# Patient Record
Sex: Female | Born: 1995
Health system: Western US, Academic
[De-identification: ages and names within clinical notes are randomized; demographics above are authoritative.]

## PROBLEM LIST (undated history)

## (undated) DIAGNOSIS — J45909 Unspecified asthma, uncomplicated: Secondary | ICD-10-CM

## (undated) SURGERY — Surgical Case
Anesthesia: *Unknown

---

## 2015-05-03 DIAGNOSIS — Q913 Trisomy 18, unspecified: Secondary | ICD-10-CM

## 2019-12-17 ENCOUNTER — Inpatient Hospital Stay
Admit: 2019-12-17 | Discharge: 2019-12-18 | Disposition: A | Discharge status: Assisted Living Facility | Payer: Commercial Managed Care - Pharmacy Benefit Manager | Source: Home / Self Care

## 2019-12-17 ENCOUNTER — Inpatient Hospital Stay

## 2019-12-17 DIAGNOSIS — F23 Brief psychotic disorder: Secondary | ICD-10-CM

## 2019-12-17 LAB — UA,Microscopic

## 2019-12-17 LAB — UA,Dipstick

## 2019-12-17 LAB — CBC: RED BLOOD CELL COUNT: 4.92 x10E6/uL (ref 3.96–5.09)

## 2019-12-17 LAB — Expedited COVID-19 and Influenza A B PCR: COVID-19 PCR: NOT DETECTED

## 2019-12-17 LAB — CREATININE: GFR ESTIMATE FOR NON-AFRICAN AMERICAN: >89 mL/min/{1.73_m2} (ref 0.60–1.30)

## 2019-12-17 LAB — Electrolyte Panel: CHLORIDE: 104 mmol/L (ref 96–106)

## 2019-12-17 LAB — Glucose, Whole Blood: GLUCOSE, WHOLE BLOOD: 107 mg/dL — ABNORMAL HIGH (ref 65–99)

## 2019-12-17 LAB — Urea Nitrogen: UREA NITROGEN: 7 mg/dL (ref 7–22)

## 2019-12-17 LAB — Pregnancy Test,Urine: PREGNANCY TEST,URINE: NEGATIVE

## 2019-12-17 LAB — Drugs of Abuse Scn,Ur: METHADONE: NEGATIVE ng/mL

## 2019-12-17 MED ADMIN — NICOTINE POLACRILEX 2 MG MT GUM: 2 mg | ORAL | @ 11:00:00 | Stop: 2019-12-19 | NDC 00536302923

## 2019-12-17 MED ADMIN — OLANZAPINE 5 MG PO TABS: 5 mg | ORAL | @ 13:00:00 | Stop: 2019-12-19

## 2019-12-17 MED ADMIN — NICOTINE 14 MG/24HR TD PT24: 14 mg | TRANSDERMAL | @ 22:00:00 | Stop: 2019-12-18 | NDC 60505706200

## 2019-12-17 NOTE — Progress Notes
CSW met with DCFS Johny Shock ( @ [phone]) in person to discuss pt's case and the safety of the kids. CSW informed Anderson Malta that pt will require psychiatric hospitalization. CSW also shared with Anderson Malta that pt is now requesting for her kids to be placed with grandmother  Carleene Overlie who lives in [place]. Carleene Overlie can be reached at [phone]

## 2019-12-17 NOTE — Progress Notes
Patient referred for psychiatric transfer; patient requires inpatient stabilization but cannot be hospitalized at [facility] at this time. Pt does not have medical insurance at this time. The following outside contracted psychiatric hospitals were contacted:  Sw spoke with Asinto at [facility] at [phone] re  bed availability, faxed pt's packet to [phone];  SW spoke with Diana at [facility] at [phone] re bed availability, faxed pt's packet to [phone]

## 2019-12-17 NOTE — Progress Notes
CSW spoke with Dalia from [facility] who stated they would not be able to accept patient at this time. CSW requested bed at [facility], however, no beds available.

## 2019-12-17 NOTE — ED Notes
Psych doctor at bedside, approached this RN and stated that pt admitted to having a gun on her person.  This RN called security for a search, pt to be put on a 5150 hold.  Security came to bedside and pt was appropriate and gave security gun.  Security explained that [employer] would pick up the gun and she would be able to request the gun from them once discharged.  Two boys at bedside, her sons, unkempt and sleeping at this time, no distress noted.  Sitter at bedside.

## 2019-12-17 NOTE — Consults
SW has been aware of case since patient arrived in ED, asking for psychiatric evaluation. Patient arrived with her two sons (3 and 7); she drove to [place] from [facility] because she is afraid people are trying to kill her. SW notified because, if placed on 5150, children may need DCFS to take custody. ED staff also with concerns re: hygiene, as both the boys and their clothes appear quite dirty.     Discussed with ED attending, resident, and Consulting civil engineer. MDs to see mom first. If mom is placed on detainment, then kids will be checked in for quick assessment. If mom placed on 5150, then Peds Hospitalist will be notified of pending DCFS report (as per protocol) and SW will contact SCAN Coordinator for guidance.     Unfortunately, several critical cases interrupted the flow of this plan. Patient was seen by psych, who determined she does need to be on a hold. Patient had a loaded gun with her; psych was able to get her to relinquish the gun to [employer] prior to her being informed of hold. Resident and SW discussed DCFS reporting; Dr. Janene Harvey will file, with SW available to assist.     Contacted SCAN Consultant Jerrye Bushy as Lorain Childes. Suzette Battiest unsure if case should be filed in [place] or [place]. Given that kids will need urgent assist, this writer believes report should be filed in [place]. Veronica in agreement with plan, asks to be kept informed.     SW called DCFS Hotline to determine where to file. Spoke to Mr. Rennis Harding (SW very familiar with Mr. Rennis Harding from previous DCFS interactions). Mr. Rennis Harding states report should be filed in [place] 1) because patient doesn't have an address anywhere right now and 2) because kids will need urgent intervention. SW appreciative.     Received call from [place], [place] Consultant. Informed her of above. She suggests [facility] be notified to assist with the boys. SW thinks [facility] comes in at 0600, will page [phone] at that time.     Drs. Cohn (psych) and ED Meda Coffee and [place]) aware of above. Charge RN aware as well.     DCFS report filed by Psych resident D. AJanene Harvey. [phone]. Per Dr. Janene Harvey, DCFS to respond immediately, to arrive sometime after 0800. Case will be signed out to both the ED SW and CL SW for follow up.        Financial clearance triggered per protocol.     Patient referred for psychiatric transfer; patient requires inpatient stabilization, but cannot be hospitalized in [facility] at this time.     Referrals (FS, 5150, Advisement, Transfer summary, labs) will be faxed to the following facilities, once labs and documentation are complete:      [facility] [phone]  [facility] [phone]    Psych/ED aware.

## 2019-12-17 NOTE — ED Notes
Patient cooperative with vitals. Pt demanded that she speak with the Doctor. Patient stated, '' I don't know what the hell is going on here! These people are tripping, I never used meth in my life''. Patient appears extremely agitated. Pt is speaks with an aggressive tone with staff. Psych tech is with RN ensuring patient is safe. Pt is also responding to internal stimuli. Staff is at bedside to monitor for safety.

## 2019-12-17 NOTE — ED Notes
Dr. Kyung Rudd with Psych rounding on patient.

## 2019-12-17 NOTE — ED Notes
Psych SW, Attending and Resident at bedside.

## 2019-12-17 NOTE — Progress Notes
CSW spoke with Weston Brass at [facility] able to accept this pt due to the fact that she is out of state and is considered to be high risk for COVID. Need to wait 7 days to repeat the test before they can accept her    CSW spoke with Corrie Dandy at [facility] the packet

## 2019-12-17 NOTE — Other
[place] of [place] - Health and Investment banker, corporate of Health Care Services   INVOLUNTARY PATIENT ADVISEMENT  (TO BE READ AND GIVEN TO THE  PATIENT AT TIME OF ADMISSION) Confidential Patient Information  See W&I Code Section 5328 and   HIPAA Priacy Rule 45 C.F.R. Section 164.508   Name of Facility     [facility]   Patient???s Name     Cynthia Donovan Admission Date     12/17/2019   Section 5150(h) of the Welfare and Institutions Code requires that each person admitted to a facility designated by the county for evaluation and treatment be given specific information orally and in writing, and in a language or modality accessible to the person and a record of the advisement be kept in the person???s medical record.   My name is Lyn Hollingshead B. Evette Diclemente My position here is Resident physician   You are being placed in this psychiatric facility because it is our professional opinion, that as a result of a mental health disorder, you are likely to: (check applicable)   [X]  Harm yourself [X]  Harm someone else [  ] Be unable to be take care of your own food clothing or shelter   (List specific facts upon which the allegation of dangerous or gravely disabled due to mental health disorder is based, including pertinent facts arising from the admission interview):   We believe this is true because  You have been feeling very depressed and like people are following you and are out to get you. These feelings have made it difficult to take care of yourself and your children at this time.     You will be held for a period of up to 72 hours. This ( []  does not ) ( [x]  does) include weekends or holidays.   Your 72-hour period begins: 12/17/19 1610    (Time and Date)   Your 72-hour evaluation and treatment period will end at: 12/20/19 9604    (Time and Date)   You will be held for a period up to 72 hours. During the 72 hours you may also be transferred to another facility. You may request to be evaluated or treated at a facility of your choice. You may request to be evaluated or treated by a mental health professional of your choice. We cannot guarantee the facility or mental health professional you choose will be available, but we will honor your choice if we can.     During these 72 hours you will be evaluated by the facility staff, and you may be given treatment, including medications. It is possible for you to be released before the end of the 72 hours. But if the staff decides that you need continued treatment you can be held for a longer period of time. If you are held longer than 72 hours, you have the right to a lawyer and a qualified interpreter and a hearing before a judge. If you are unable to pay for the lawyer, then one will be provided to you free of charge.     If you have questions about your legal rights, you may contact the county Patients??? Rights Advocate at [phone] or [phone] (phone number of county Patients??? Art therapist).   Good cause for Incomplete Advisement N/A Date N/A   Advisement Completed by  (Electronically signed by)  Cecille Po. Seirra Kos Position  Resident physician Language or Modality Used  Spoken English Date  12/17/2019  CC: Original to the Patient   DHCS 1802 (03/2012) [place] to the Patient???s Record

## 2019-12-17 NOTE — ED Notes
Pt stable, 2 children at bedside, no distress noted.

## 2019-12-17 NOTE — ED Notes
Psych resident and SW at bedside to discuss plan of care.

## 2019-12-17 NOTE — ED Provider Notes
Cynthia Donovan [facility]  Emergency Department Service Report    Triage   Psychiatric Evaluation (asking to be evaluated by psychiatrist ; stated that she is being followed and no one is believing her; denies SI, denies Hi; has 2 sons 61 and 26 years old with her. Arrived here from [facility] Wednesday morning)    Arrived on 12/17/2019 at 12:34 AM  Arrived by Walk-in [14]    ED Triage Vitals   Temp Temp Source BP Heart Rate Resp SpO2 O2 Device Pain Score Weight   12/17/19 0047 12/17/19 0935 12/17/19 0047 12/17/19 0047 12/17/19 0047 12/17/19 0047 12/17/19 0047 12/17/19 0736 12/17/19 0047   36.4 ???C (97.6 ???F) Temporal 131/90 81 20 98 % None (Room air) Patient Cynthia Donovan (!) 104.3 kg (230 lb)       No Known Allergies     Initial Physician Contact   Comprehensive Exam Initiated  Contact Date: 12/17/19  Contact Time: 100    History   24 year old female with no past medical or psychiatric history here today for delusions.  The patient lost a daughter at 79 months to SIDS in mid August and since then has been having thoughts that she is being pursued by her family and potentially also the FBI/CIA/police (doesn't know which and doesn't want to specify further). She doesn't hear voices or whispers, says she occasionally sees shadows, no SI or HI, thoughts of harm against her two boys. She's not told anyone about her persecutory delusions or seen a doctor of any type. About a week ago, she was driving around [facility] (where she lives) with her boys in the car when she saw a road sign saying, [address];'' she decided at that point to drive to [place]. She's been staying in a motel (reportedly) since then. She admits, then doesn't want to discuss, that she fled [place] thinking she was being pursued. Yesterday evening, she decided to go back to [place] (unclear reasons) but en route decided to come back to [place] and see a doctor. No medical history, complaints today, no medications. Denies drug or alcohol use. No attempts at [facility] today. She's never been on psychiatric medications, never seen a psychiatrist.    History reviewed. No pertinent past medical history.     History reviewed. No pertinent surgical history.     Past Family History   family history is not on file.     Past Social History   she has no history on file for tobacco, alcohol, drug, and sexual activity.   see HPI for a very detailed social history    Review of systems:  Review of Systems   Constitutional: Negative for chills, diaphoresis and fever.   HENT: Negative for congestion and sore throat.    Eyes: Negative for blurred vision.   Respiratory: Negative for cough and shortness of breath.    Cardiovascular: Negative for chest pain, palpitations and leg swelling.   Gastrointestinal: Negative for abdominal pain, diarrhea, nausea and vomiting.   Genitourinary: Negative for dysuria, frequency, hematuria and urgency.   Musculoskeletal: Negative for back pain, joint pain and neck pain.   Skin: Negative for rash.   Neurological: Negative for tingling, sensory change, speech change, focal weakness and headaches.   Endo/Heme/Allergies: Negative for environmental allergies. Does not bruise/bleed easily.   Psychiatric/Behavioral: Negative for depression, hallucinations, substance abuse and suicidal ideas. The patient is nervous/anxious. The patient does not have insomnia.         Persecutory delusions   All  other systems reviewed and are negative.        Physical Exam   Vital signs:   ED Triage Vitals   Temp Temp Source BP Heart Rate Resp SpO2 O2 Device Pain Score Weight   12/17/19 0047 12/17/19 0935 12/17/19 0047 12/17/19 0047 12/17/19 0047 12/17/19 0047 12/17/19 0047 12/17/19 0736 12/17/19 0047   36.4 ???C (97.6 ???F) Temporal 131/90 81 20 98 % None (Room air) Patient Cynthia Donovan (!) 104.3 kg (230 lb)    (reviewed by me)    General: well-developed, well-nourished, appears stated age  HENT: NC/AT, MMM  Eye: EOMI, no conjunctival injection, lids are symmetric  Neck: supple, trachea midline  CV: RRR, symmetric radial pulses, no LE edema  Resp: CTA bilaterally, no crackles or wheezes, unlabored on room air  Abd: soft, nontender, nondistended, no guarding or rebound  MSK: no obvious deformity or TTP; full observed ROM; WWP, SILT  Skin: warm, dry, no pallor  Neuro: sensation and movement grossly intact to all four extremities  Psych: blunted affect, avoids eye contact, laughs a bit during our evaluation; -SI/HI/AH/VH, reports persecutory/pursuit delusions      Initial Assessment and Plan   Cynthia Donovan is a 24 y.o. female with delusional content. VS unremarkable. PE unremarkable. Stated content is c/f for GD, so I???ll consult psychiatry for hold evaluation and possible psychiatric admission; will obtain basic screening labs to facilitate their admission process. low suspicion for an underlying medical emergency at this time, given absence of complaints and unremarkable PE. doubt intoxication, as patient denies use and no evidence on exam. doubt intentional ingestion/toxidrome, given patient denial and absence of clinical evidence on exam + history; will observe over course of ED stay. it???s difficult to distinguish between a psychotic vs mood disorder at this time, so I???ll defer that evaluation to psychiatry. Plan for f/u basic labs, psych recs, and observation in ED. DCFS report to be filed for children.      Chart Review   Previous medical records requested.  Pertinent items reviewed: prior records, if available, were summarized in the HPI and MDM      ED Course     Laboratory Results     Labs Reviewed   ELECTROLYTE PANEL - Abnormal; Notable for the following components:       Result Value    Sodium 134 (*)     All other components within normal limits   GLUCOSE - Abnormal; Notable for the following components:    Glucose 107 (*)     All other components within normal limits   CBC - Abnormal; Notable for the following components:    White Blood Cell Count 10.49 (*)     Mean Platelet Volume 8.9 (*)     All other components within normal limits   UA,DIPSTICK - Abnormal; Notable for the following components:    Blood 3+ (*)     Leukocyte Esterase 1+ (*)     All other components within normal limits   UA,MICROSCOPIC - Abnormal; Notable for the following components:    RBC per uL 607 (*)     WBC per uL 48 (*)     RBC per HPF 127 (*)     WBC per HPF 10 (*)     Bacteria Present (*)     Hyaline Casts 3-5/LPF (*)     All other components within normal limits   DRUGS OF ABUSE SCN,UR - Abnormal; Notable for the following components:    Amphetamine/Meth Positive (*)  Cannabinoids Positive (*)     All other components within normal limits    Narrative:     If the screen result is not consistent with the patient's medication(s), confirmation testing should be ordered for the drug(s) of interest.    Unconfirmed screening results should be used only for medical (i.e., treatment) purposes and not for non-medical purposes (e.g., employment testing, legal testing.)    Phencyclidine testing is not included in the drug screen. It can be ordered separately (test [phone]).   UREA NITROGEN - Normal   PREGNANCY TEST,URINE - Normal   EXPEDITED COVID-19 AND INFLUENZA A B PCR, RESPIRATORY UPPER    Narrative:     This test is intended for in vitro diagnostic use under FDA Emergency Use Authorization only. Results are for the presumptive identification of COVID-19, Influenza A, and Influenza B RNA. [facility] is certified under the Clinical Laboratory Improvement Amendments of 1988 (CLIA-88) as qualified to perform high complexity clinical laboratory testing.   CREATININE,WHOLE BLOOD   URINALYSIS W/REFLEX TO CULTURE    Narrative:     The following orders were created for panel order Urinalysis w/Reflex to Culture.  Procedure                               Abnormality         Status                     ---------                               -----------         ------                     UA,Dipstick[[id number]] Abnormal            Final result               UA,Microscopic[[id number]]               Abnormal            Final result                 Please view results for these tests on the individual orders.       Imaging Results     No orders to display       Progress Notes / Reassessments     ED Course as of Dec 28 1112   Thu Dec 17, 2019   0409 psych is aware, wil lcome see the patient    [AF]   [phone] psych has placed on a hold    [AF]   0445 patient was found to be carrying a loaded pistol with the safety off; the weapon was confiscated by security. social work will be placing a DCFS report.    [AF]   [id number] signed out to oncoming team to f/u admission as [facility].    [AF]   [phone] Received signout from outgoing resident. New delusions after SIDS death of son, paranoid with thoughts of being harmed. Here with two children, here on 5150 for GD. Found to have loaded gun without safety on. DCFS report pending    [BM]   1507 Received sign-out from Dr. Santiago Bumpers. Pending to-do's: [ ]  NTD, patient awaiting Board & Transfer for GD. 64y F w/no significant pmhx p/w  paranoia after SIDS death of son. Course: Found to have loaded gun w/o safety on, now confiscated. Here w/two children, DCFS report pending.    [RH]   2230 Signout given to Dr. Cipriano Mile. Pending to-do's: [ ]  NTD. Patient awaiting board & transfer for GD.    [RH]   Fri Dec 18, 2019   1610 Signed out to oncoming team to follow up board and transfer process.    [AF]   9604 Signout received pending B&T, 24 y/o F on 5150 for GD.     [TJ]   1255 Dw psych who have re-evaluated pt and cleared her for discharge to recouperative care as coordinated by psych SW. Will dc with short rx olanzapine at bedtime per psych recs.    [TJ]      ED Course User Index  [AF] Wess Botts., MD  [BM] Beatrice Lecher., MD  [RH] Margot Ables, MD  [TJ] Kathleen Argue., MD       ED Procedure Notes   Any ED procedures performed are documented on separate ED procedure notes.    Clinical Impression     1. Acute psychosis (HCC/RAF)        Disposition and Follow-up   Disposition: Discharge [1]     No future appointments.    Follow up with: No follow-up provider specified.    Return precautions are specified on After Visit Summary.    Discharge Medication List as of 12/18/2019  2:45 PM      START taking these medications    Details   OLANZapine 5 mg tablet Take 1 tablet (5 mg total) by mouth at bedtime., Starting Fri 12/18/2019, Normal             Orders Placed This Encounter   ??? Expedited COVID-19 and Influenza A B PCR, Respiratory Upper   ??? Bacterial Culture Urine   ??? Electrolyte Panel (Na, K, Cl, CO2)   ??? Urea Nitrogen   ??? Creatinine   ??? Glucose   ??? CBC without differential   ??? Urine pregnancy (ICON)   ??? Urinalysis w/Reflex to Culture   ??? UA,Dipstick   ??? UA,Microscopic   ??? Consult to Psychiatry Adult   ??? Consult to Social Work - Transfer to Psychiatric Facility   ??? Bed Request- Behavioral Health   ??? Drugs of Abuse Scn,Ur   ??? DISCONTD: nicotine polacrilex 2 mg gum 2 mg   ??? DISCONTD: OLANZapine tab 5 mg   ??? DISCONTD: OLANZapine inj 10 mg   ??? DISCONTD: OLANZapine tab disolv 5 mg   ??? nicotine 14 mg/24 hr patch 14 mg   ??? OLANZapine 5 mg tablet       Resident Signature          Wess Botts., MD  Resident  12/17/19 5409       Margot Ables, MD  Resident  12/17/19 2250       Wess Botts., MD  Resident  12/18/19 8119       Kathleen Argue., MD  Resident  12/18/19 1506    ATTENDING NOTE    I was present with the resident during the key/critical portions of this service.  I have discussed the management with the resident, have reviewed the resident note and agree with the documented findings and plan of care.    Redge Gainer, MD        Sydnee Cabal., MD, MPH  12/29/19 1114

## 2019-12-17 NOTE — Progress Notes
CSW received call from patient's mother who stated she was returning a phone call she received from a doctor and left her phone number [phone]) CSW called DCFS SW Victorino Dike Jones-Philips [phone]) and left voicemail with patient's mother's phone number to follow up re: placement for patient's 2 children.

## 2019-12-17 NOTE — ED Notes
Report given to Liz, RN

## 2019-12-17 NOTE — Progress Notes
Patient Assessment:  This visit was conducted in-person with the patient.    Patient was re-evaluated this morning by the [facility] ED team.    Interval Events:   - NAEO  - DCFS report filed, pt evaluated in person, children now in DCFS custody    Medication Adherence: Patient refused all scheduled medications.    PRNs: None required.    Subjective: On interview this morning, patient is able to confirm the history gathered in the initial ED interview. Mental status exam largely unchanged. Continues to express paranoia regarding nursing staff, and is concerned that some staff may be the same people who were following her. She was unable to provide meaningful collateral or family who could assume responsibility of her children while hospitalized. Denies SI or recent severe depression.    Collateral:   No emergency contact information on file.    No collateral contact today.    Mental Status Exam:  Exam is unchanged from initial evaluation.    Vital Signs:   Vitals:    12/17/19 0047   BP: 131/90   Pulse: 81   Resp: 20   Temp: 36.4 ???C (97.6 ???F)   SpO2: 98%   Weight: (!) 104.3 kg (230 lb)   Height: 1.676 m (5' 6'')       Laboratory studies reviewed:  Recent Results (from the past 72 hour(s))   Electrolyte Panel (Na, K, Cl, CO2)    Collection Time: 12/17/19  5:26 AM   Result Value Ref Range    Sodium 134 (L) 135 - 146 mmol/L    Potassium 3.6 3.6 - 5.3 mmol/L    Chloride 104 96 - 106 mmol/L    Total CO2 22 20 - 30 mmol/L    Anion Gap 8 8 - 19 mmol/L   Urea Nitrogen    Collection Time: 12/17/19  5:26 AM   Result Value Ref Range    Urea Nitrogen 7 7 - 22 mg/dL   Creatinine    Collection Time: 12/17/19  5:26 AM   Result Value Ref Range    Creatinine 0.87 0.60 - 1.30 mg/dL    GFR Estimate for African American >89 See GFR Additional Information mL/min/1.39m2    GFR Estimate for Non-African American >89 See GFR Additional Information mL/min/1.69m2    GFR Additional Information See Comment    Glucose    Collection Time: 12/17/19 5:26 AM   Result Value Ref Range    Glucose 107 (H) 65 - 99 mg/dL   CBC without differential    Collection Time: 12/17/19  5:26 AM   Result Value Ref Range    White Blood Cell Count 10.49 (H) 4.16 - 9.95 x10E3/uL    Red Blood Cell Count 4.92 3.96 - 5.09 x10E6/uL    Hemoglobin 14.8 11.6 - 15.2 g/dL    Hematocrit 28.4 13.2 - 45.2 %    Mean Corpuscular Volume 87.0 79.3 - 98.6 fL    Mean Corpuscular Hemoglobin 30.1 26.4 - 33.4 pg    MCH Concentration 34.6 31.5 - 35.5 g/dL    Red Cell Distribution Width-SD 42.0 36.9 - 48.3 fL    Red Cell Distribution Width-CV 13.2 11.1 - 15.5 %    Platelet Count, Auto 269 143 - 398 x10E3/uL    Mean Platelet Volume 8.9 (L) 9.3 - 13.0 fL    Nucleated RBC%, automated 0.0 No Ref. Range %    Absolute Nucleated RBC Count 0.00 0.00 - 0.00 x10E3/uL   Expedited COVID-19 and Influenza A B PCR,  Respiratory Upper    Collection Time: 12/17/19  9:28 AM    Specimen: Nasopharyngeal; Respiratory, Upper   Result Value Ref Range    Specimen Type Respiratory, Upper     COVID-19 PCR Not Detected Not Detected    Influenza A PCR Not Detected Not Detected    Influenza B PCR Not Detected Not Detected   Urine pregnancy (ICON)    Collection Time: 12/17/19 10:03 AM    Specimen: Clean Catch, Midstream; Urine   Result Value Ref Range    Pregnancy Test,Urine Negative     Drugs of Abuse Scn,Ur    Collection Time: 12/17/19 10:03 AM    Specimen: Clean Catch, Midstream; Urine   Result Value Ref Range    Amphetamine/Meth Positive (A) detection limit at 1000 ng/mL    Barbiturates Negative detection limit at 200 ng/mL    Benzodiazepines Negative detection limit at 200 ng/mL    Cannabinoids Positive (A) detection limit at 50 ng/mL    Cocaine Negative detection limit at 300 ng/mL    Methadone Negative detection limit at 300 ng/mL    Opiates Negative detection limit at 300 ng/mL    Oxycodone Negative detection limit at 100 ng/mL    Ethanol Negative detection limit at 10 mg/dL    Hydrocodone Negative detection limit at 100 ng/mL Fentanyl Negative detection limit at 1 ng/mL   UA,Dipstick    Collection Time: 12/17/19 10:03 AM    Specimen: Clean Catch, Midstream; Urine   Result Value Ref Range    Urine Color Colorless      Specific Gravity 1.007 1.005 - 1.030    pH,Urine 6.0 5.0 - 8.0    Blood 3+ (A) Negative    Bilirubin Negative Negative    Ketones Negative Negative    Glucose Negative Negative    Protein Negative Negative    Leukocyte Esterase 1+ (A) Negative    Nitrite Negative Negative   UA,Microscopic    Collection Time: 12/17/19 10:03 AM    Specimen: Clean Catch, Midstream; Urine   Result Value Ref Range    RBC per uL 607 (H) 0 - 11 cells/uL    WBC per uL 48 (H) 0 - 22 cells/uL    RBC per HPF 127 (H) 0 - 2 cells/HPF    WBC per HPF 10 (H) 0 - 4 cells/HPF    Bacteria Present (A) Absent    Squamous Epi Cells 8 0 - 17 cells/uL    Hyaline Casts 3-5/LPF (A) 0-2/LPF /LPF       Updated Assessment and Plan:  Given continued significant paranoia with refusal to follow-up with outpatient psychiatry and inability to participate in meaningful discharge planning, the patient continues to require psychiatric hospitalization at this time. 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 Plan as follows:    Psychiatric:   - Transfer to OSH  - Continue current medications without change  - Routine observation and safety precautions   - DCSF reports filed, both verbal and text. Copy in chart. Reports ID# [id number]    Medical:  No acute or chronic medical problems.     Legal:   - 5150 (72-hour) hold for Danger to Others and Danger to Self to expire 12/20/19 0420    Disposition: B&T    The above re-evaluation note was written by Caroline Sauger, PGY-2 psychiatry resident. Patient was seen and discussed with attending psychiatrist, Dr. Helyn App, with whom the above assessment and plan jointly formulated. Updated recommendations  and plan were discussed with referring treatment team. Please page [phone] with any further questions.         I have evaluated the patient, performed a mental status exam, and agree with the findings and plan as documented in the resident's note. Darliss Ridgel, MD
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
## 2019-12-17 NOTE — ED Notes
Victorino Dike with DCFS at bedside. Psych SW Dina notified.

## 2019-12-17 NOTE — Other
[facility] Department of Mental Health ??? MH 302 NCR                                                 Department of Health Care Services  [place]                                       Health and Health and safety inspector Agency   APPLICATION FOR ASSESSMENT, EVALUATION, AND CRISIS INTERVENTION OR PLACEMENT FOR EVALUATION AND TREATMENT  Confidential Client/Patient Information  See [place] W&I Code, Section 5328 and HIPAA Privacy Rule 45 C.F.R. ??? [id number]  Welfare and Institutions Code (W&I Code), Section 5150(f) and (g), require that each person, when first detained for psychiatric evaluation, be given certain specific information orally and a record be kept of the advisement by the evaluating facility. DETAINMENT ADVISEMENT  My name is ________________________  I am a (peace officer/mental health professional) with (name of agency).  You are not under criminal arrest, but I am taking you for examination by mental health professionals at (name of facility).     You will be told your rights by the mental health staff.     If taken into custody at his or her residence, the person shall also be told the following information:       [X]  Advisement Complete        [  ] Advisement Incomplete      You may bring a few personal items with you, which I will have to approve. Please inform me if you need assistance turning off any appliance or water. You may make a phone call and leave a note to tell your friends or family where you have been taken.   Good Cause for Incomplete Advisement:                                                                                               Advisement Completed By:  Cecille Po. Wilfred Siverson        Position:   Resident physician        Language or Modality Used:  Spoken English        Date of Advisement:  12/17/2019          To (name of 5150 designated facility): [facility] at [facility]    Application is hereby made for the assessment and evaluation of Cynthia Donovan     residing at , , , , [place], for up to 72-hour assessment, evaluation, and crisis intervention or placement for evaluation and treatment at a designated facility pursuant to Section 5150, et seq. (adult) or Section 5585 et seq. (minor), of the W&I Code. If a minor, authorization for voluntary treatment is not available and to the best of my knowledge, the legally responsible party appears to be/is: (Check one):  []  Parent;  []  Legal  Guardian;  []  Conservator;  []  Juvenile Court under W&I Code 300;  []  Juvenile Court under W&I Code 601/602.      If known, provide names, address and telephone numbers in area provided below:   N/A          The above person???s condition was called to my attention under the following circumstances:    [facility], consultation to Psychiatry        I have probable cause to believe that the person is, as a result of a mental health disorder, a danger to others or to himself/herself, or gravely disabled because: (state specific facts):   The patient drove from [facility] to [place] with her children because she felt people were pursuing her. She continues to have delusional thoughts that her family and unknown parties are out to get her. Additionally, she had been having thoughts of wanting to end her life following the recent death of her daughter.         (CONTINUED ON NEXT PAGE)   Reference: DHCS 1801 (06/18)                                                                                                                     Page 1 of 4  Original Application: Accompany Client to Assessment, Evaluation, and Crisis Intervention Location or 5150/5585 Designated Facility   [facility] Department of Mental Health ??? MH 302 NCR                                                 Department of Health Care Services  [place]                                       Health and Human Services Agency            CLIENT NAME: Cynthia Donovan   APPLICATION FOR 72 HOUR DETENTION FOR EVALUATION AND TREATMENT (CONTINUED)     Historical course of the person???s mental disorder:  [X]  I have considered the historical course of the person???s mental disorder: [Includes evidence presented by service/support provider, family member(s), and person subject to probable cause determination or designee.]  N/A    [  ] No reasonable bearing on determination  [X]  No information available because: Family not answering phone       History Provided by (Name) Address Phone Number Relation                  Based upon the above information, there is probable cause to believe that said person is, as a result of mental health disorder:     [X]  A danger to himself/herself.  [X]  A danger to others.        [  ]  Gravely disabled adult.  [  ] Gravely disabled minor.        Minors only: []  Based upon the above information, it appears that there is probable cause to believe that authorization for voluntary treatment is not available.   Signature, title, and badge number of Tax adviser, professional person in charge of the facility designated by the county for evaluation and treatment, member of the attending staff, designated members of a mobile crisis team, or professional person designated by [employer].     X Louvenia Golomb B. Stassi Fadely Badge/NPI#: [id number]   (Electronically signed)      Date: 12/17/2019       Phone:  [phone]        Time: 4:33 AM         Name of Law Enforcement Agency or Evaluation Facility/Person:    [facility] at [facility] Address of [employer] Agency or Evaluation Facility/Person:   [address] [place], [place] [address]   For patients in Medical ER???s, detention began:  Date: 12/17/19  Time: 0435           NOTIFICATIONS TO BE PROVIDED TO LAW ENFORCEMENT AGENCY     Notify (officer/unit & telephone #): N/A        NOTIFICATION OF PERSON???S RELEASE IS REQUESTED BY THE REFERRING PEACE OFFICER BECAUSE:   [  ] The person has been referred to the facility under circumstances which, based upon an allegation of facts regarding actions witnessed by the officer or another person, would support the filing of a criminal complaint.  []  Weapon was Engineer, drilling to Section 8102 W&I Code. Upon release, facility is required to provide notice to the person regarding the procedure to obtain return of any confiscated firearm pursuant to Section 8102 W&I Code.   Reference: [facility] 1801 (06/18)                                                                                                                     Page 2 of 4  Original Application: Accompany Client to Assessment, Evaluation, and Crisis Intervention Location or 5150/5585 Designated Facility   [facility] Department of Mental Health ??? MH 302 NCR                                                 Department of Health Care Services  [place]                                       Health and Health and safety inspector Agency   SEE SUBSEQUENT PAGES FOR DEFINITIONS AND REFERENCES     DEFINITIONS AND REFERENCES   ???Gravely Disabled??? means a condition in which a person, as a result of a mental disorder, is unable  to provided for his or her basic personal needs for food, clothing and shelter.  SECTION 5008(h) W&I Code    ???Gravely Disabled Minor??? means a minor who, as a result of a mental disorder, is unable to use the elements of life which are essential to health, safety, and development, including food, clothing, and shelter, even though provided to the minor by others.  Intellectual disability, epilepsy, or other developmental disabilities, alcoholism, other drug abuse, or repeated antisocial behavior do not, by themselves, constitute a mental disorder.  SECTION 5585.25 W&I Code    ???Tax adviser??? means a duly sworn Tax adviser as that term is defined in Chapter 4.5 (commencing with Section 830) of Title 3 of Part 2 of the Penal Code who has completed the basic training course established by the Commission on Dance movement psychotherapist, or any Civil Service fast streamer or probation officer specified in Section 830.5 of the Penal Code when acting in relation to cases for which he or she has a legally mandated responsibility. SECTION 5008 (i) W&I Code    Section 5152.1 W&I Code: The professional person in charge of the facility providing 72-hour evaluation and treatment, or his or her designee, shall notify the county Secondary school teacher or the director's designee and the Tax adviser who makes the written application pursuant to Section 5150 or a person who is designated by the Patent examiner agency that employs the Tax adviser, when the person has been released after 72-hour detention, when the person is not detained, or when the person is released before the full period of allowable 72-hour detention if all the conditions apply:    (a)  The Tax adviser requests such notification at the time he or she makes the application and the Tax adviser certifies at that time in writing that the person has been referred to the facility under circumstances which, based upon an allegation of facts regarding actions witnessed by the officer or another person, would support the filing of a criminal complaint.   (b) The notice is limited to the person's name, address, date of admission for 72-hour evaluation and treatment, and date of release.        If a Emergency planning/management officer, Sales executive, or designee of the Sales executive, possesses any record of information obtained pursuant to the notification requirements of this section, the officer, agency, or designee shall destroy that record two years after the receipt of notification.     Section 5150.05 W&I Code:    (a)  When determining if probable cause exists to take a person into custody, or cause a person to be taken into custody, pursuant to Section 5150, any person who is authorized to take that person, or cause that person to be taken, into custody pursuant to that section shall consider available relevant information about the historical course of the person's mental disorder if the authorized person determines that the information has a reasonable bearing on the determination as to whether the person is a danger to others, or to himself or herself, or is gravely disabled as a result of the mental disorder.   (b) For purposes of this section, ''information about the historical course of the person's mental disorder'' includes evidence presented by the person who has provided or is providing mental health or related support services to the person subject to a determination described in subdivision (a), evidence presented by one or more members of the family of that person, and evidence presented by the person  subject to a determination described in subdivision (a) or anyone designated by that person.      Reference: [facility] 1801 (06/18)                                                                                                                     Page 3 of 4  Original Application: Accompany Client to Assessment, Evaluation, and Crisis Intervention Location or 5150/5585 Designated Facility   [facility] Department of Mental Health ??? MH 302 NCR                                                 Department of Health Care Services  [place]                                       Health and Hospital doctor   DEFINITIONS AND REFERENCES (CONTINUED)    (c)  If the probable cause in subdivision (a) is based on the statement of a person other than the one authorized to take the person into custody pursuant to Section 5150, a member of the attending staff, or a professional person, the person making the statement shall be liable in a civil action for intentionally giving any statement that he or she knows to be false.   (d) This section shall not be applied to limit the application of Section 5328.          Section 5152.2 W&I Code: Each Patent examiner agency within a county shall arrange with the county Secondary school teacher a method for giving prompt notification to English as a second language teacher pursuant to Section 5152.1 W&I Code.     Section 5585.50 W&I Code: The facility shall make every effort to notify the minor's parent or legal guardian as soon as possible after the minor is detained. Section 5585.50 W&I Code.    A minor under the jurisdiction of the [employer] under Section 300 W&I Code, is due to abuse, neglect or exploitation.    A minor under the jurisdiction of the Juvenile Court under Section 601 W&I Code is due to being adjudged a ward of the court as a result of being out of parental control.    A minor under the jurisdiction of the Juvenile Court under Section 602 W&I Code is due to being adjudged a ward of the court because of crimes committed.     Section 8102 W&I Code (EXCERPTS FROM):    (a)  Whenever a person who has been detained or apprehended for examination of his or her mental condition or who is a person described in Section 8100 or 8103, is found to own, have in his or her possession or under his or her control, any firearm whatsoever, or any deadly weapon, the  firearm or other deadly weapon shall be confiscated by any IT consultant, who shall retain custody of the firearm or other deadly weapon. ''Deadly weapon,'' as used in this section, has the meaning prescribed by Section 8100.   (b) (1) Upon confiscation of any firearm or other deadly weapon from a person who has been detained or apprehended for examination of his or her mental condition, the Tax adviser or law enforcement agency shall issue a receipt describing the deadly weapon or any firearm and listing any serial number or other identification on the firearm and shall notify the person of the procedure for the return, sale, transfer, or destruction of any firearm or other deadly weapon which has been confiscated. A peace officer or law enforcement agency that provides the receipt and notification described in Section 33800 of the Penal Code satisfies the receipt and notice requirements.  (2) If the person is released, the professional person in charge of the facility, or his or her designee, shall notify the person of the procedure for the return of any firearm or other deadly weapon which may have been confiscated.  (3) Health facility personnel shall notify the confiscating law enforcement agency upon release of the detained person, and shall make a notation to the effect that the facility provided the required notice to the person regarding the procedure to obtain return of any confiscated firearm.        Health and Safety Code [phone] (d)     A person detained under this section in a medical emergency room shall be credited for the time detained, up to twenty-four hours, in the event he or she is placed on a 72-hour hold pursuant to Section 5150 of the Welfare and Institutions Code.     Reference: DHCS 1801 (06/18)                                                                                                                     Page 4 of 4  Original Application: Accompany Client to Assessment, Evaluation, and Crisis Intervention Location or 5150/5585 Designated Facility

## 2019-12-18 MED ORDER — OLANZAPINE 5 MG PO TABS
5 mg | ORAL_TABLET | Freq: Every evening | ORAL | 0 refills | 15.00 days | Status: AC
Start: 2019-12-18 — End: ?
  Filled 2019-12-19: qty 15, 15d supply, fill #0

## 2019-12-18 MED ADMIN — OLANZAPINE 5 MG PO TABS: 5 mg | ORAL | @ 08:00:00 | Stop: 2019-12-19 | NDC 60505311100

## 2019-12-18 NOTE — Progress Notes
CSW spoke with pt's mother Lorene Dy at [phone]), provided her with update on pt's current admission to ED and gathered collateral info:    ??? Romalisha suspects that pt has been using drugs for at least several years; she stated that several months ago a ''dealer'' knocked on her door and told her that pt ''owned him tons of money for pills''  ??? Pt has  Been paranoid for at least 2-3 years stating that people were ''out there to get her''. Per Romalisha-pt went to the police stating once asking them for protection from ''people''  ??? Pt has been renting a one bedroom apartment with her boyfriend. Pt used to work as a Electrical engineer till recently. Per Romalisha-pt was recently fired after she pulled up a gun on a customer  ??? Romalisha stated that pt lost two babies to SIDS; one  in 2019 and in 10/2019  ??? Romalisha stated that she is open to coming to [place] to pick up her grandkids    CSW spoke with  [facility] SW Victorino Dike at [phone] and updated her on pt's status, informed her on pt being DC to recup care, provided her with the most updated info.  CSW informed Victorino Dike that pt's mother was willing to come to [place] to get the kids  CSW facilitated a phone conversation between [place] and pt to discuss pt's case and next steps

## 2019-12-18 NOTE — Nursing Note
DCL Notes:    1503  Arrived in D[facility] from ER accompanied by [facility] RN via wheelchair.  Awake, [employer], waiting for ride to recup care.  Florentino from [facility] recup care requested several uber rides for patient that didn't arrive to [place].  1820  Patient went home via lyft gray toyota corolla lic#219 driver Amir with all personal belongings.

## 2019-12-18 NOTE — ED Notes
Pt provided with towels soap and toothbrush/toothpaste per request. New gown provided.

## 2019-12-18 NOTE — ED Notes
Pt sleeping calmly, breathing equal and unlabored, no signs of acute distress noted. Sitter at bedside. RN to continue to monitor.

## 2019-12-18 NOTE — ED Notes
Pt continues to sleep calmly. Breathing equal and unlabored, no signs of acute distress noted. RN to continue to monitor. Sitter at bedside

## 2019-12-18 NOTE — Progress Notes
Psychiatric ED Progress Note 12/18/2019   Patient Name: Cynthia Donovan   Patient MRN: [id number]   Date of Birth: Dec 22, 1995   Patient Location: RR23/RR23     Patient Assessment:  This visit was conducted in-person with the patient.    Identifying Data:  Cynthia Donovan is a 24 y.o. female no PPHx, PMhx of asthma, presenting with 2 months of worsening depression and persecutory and paranoid delusions following the death of her infant daughter to 40.    Collateral Contact Information:  No emergency contact information on file.    No collateral contact today.    Interval Events:  - NAEO  - Patient intermittently paranoid and mildly agitated  - No PRNs  - Adherent to standing olanzapine    Subjective:  On re-evaluation today, the patient is linear and euthymic, and reports feeling ''tired.'' She continues to deny use of amphetamines, but does endorse past use of MDMA, last several weeks to months ago. She has been using daily marijuana, last 3 days prior to presentation. She denies current paranoia and feels safe in the hospital. She denies having stable housing in [facility], but wants to stay in [place] to work with DCFS to get her children back into her custody. Patient denies SI, depressed mood.    Current Medications:   Scheduled Meds Sorted by Name for Cynthia Donovan, Cynthia Donovan as of 12/18/19 3244    Legend:    Given Hold Not Given Due Canceled Entry Other Actions    Time Time (Time) Time  Time-Action       Inactive     Active     Linked           Medications 12/17/19 12/18/19    nicotine 14 mg/24 hr patch 14 mg  Dose: 14 mg  Freq: STAT Route: TD  Start: 12/17/19 1445   End: 12/18/19 1502    1502            1502     1502-D/C'd          OLANZapine tab 5 mg  Dose: 5 mg  Freq: Every night at bedtime Route: PO  Start: 12/17/19 0445   End: 01/15/20 2059    (0541)            0000     2100                       PRN Meds Sorted by Name for Cynthia Donovan as of 12/18/19 0102    Legend:    Given Hold Not Given Due Canceled Entry Other Actions Time Time (Time) Time  Time-Action       Inactive     Active     Linked           Medications 12/17/19 12/18/19    nicotine polacrilex 2 mg gum 2 mg  Dose: 2 mg  Freq: Every 1 hour PRN Route: PO  PRN Reason: Smoking cessation  Start: 12/17/19 0357   End: 01/16/20 0356    0404                OLANZapine inj 10 mg  Dose: 10 mg  Freq: Once as needed Route: IM  PRN Reason: Agitation  PRN Comment: for emergency  Start: 12/17/19 0432   End: 01/15/20 2359         OLANZapine tab disolv 5 mg  Dose: 5 mg  Freq: 3 times daily PRN Route: PO  PRN Reason: Agitation  Start: 12/17/19 0636   End: 01/16/20 0635                  Vital Signs:   Vitals:    12/17/19 0047 12/17/19 0935 12/17/19 1530 12/17/19 2103   BP: 131/90 100/53 104/56 102/63   Pulse: 81 61 62 60   Resp: 20 18 18 18    Temp: 36.4 ???C (97.6 ???F) 36.6 ???C (97.9 ???F) 36.2 ???C (97.2 ???F) 36.8 ???C (98.2 ???F)   TempSrc:  Temporal Temporal Oral   SpO2: 98% 94% 100% 98%   Weight: (!) 104.3 kg (230 lb)      Height: 1.676 m (5' 6'')          Laboratory Data and Studies (last 24 hours):  Recent Laboratory Results:  Recent Results (from the past 72 hour(s))   Electrolyte Panel (Na, K, Cl, CO2)    Collection Time: 12/17/19  5:26 AM   Result Value Ref Range    Sodium 134 (L) 135 - 146 mmol/L    Potassium 3.6 3.6 - 5.3 mmol/L    Chloride 104 96 - 106 mmol/L    Total CO2 22 20 - 30 mmol/L    Anion Gap 8 8 - 19 mmol/L   Urea Nitrogen    Collection Time: 12/17/19  5:26 AM   Result Value Ref Range    Urea Nitrogen 7 7 - 22 mg/dL   Creatinine    Collection Time: 12/17/19  5:26 AM   Result Value Ref Range    Creatinine 0.87 0.60 - 1.30 mg/dL    GFR Estimate for African American >89 See GFR Additional Information mL/min/1.29m2    GFR Estimate for Non-African American >89 See GFR Additional Information mL/min/1.50m2    GFR Additional Information See Comment    Glucose    Collection Time: 12/17/19  5:26 AM   Result Value Ref Range    Glucose 107 (H) 65 - 99 mg/dL   CBC without differential Collection Time: 12/17/19  5:26 AM   Result Value Ref Range    White Blood Cell Count 10.49 (H) 4.16 - 9.95 x10E3/uL    Red Blood Cell Count 4.92 3.96 - 5.09 x10E6/uL    Hemoglobin 14.8 11.6 - 15.2 g/dL    Hematocrit 01.0 93.2 - 45.2 %    Mean Corpuscular Volume 87.0 79.3 - 98.6 fL    Mean Corpuscular Hemoglobin 30.1 26.4 - 33.4 pg    MCH Concentration 34.6 31.5 - 35.5 g/dL    Red Cell Distribution Width-SD 42.0 36.9 - 48.3 fL    Red Cell Distribution Width-CV 13.2 11.1 - 15.5 %    Platelet Count, Auto 269 143 - 398 x10E3/uL    Mean Platelet Volume 8.9 (L) 9.3 - 13.0 fL    Nucleated RBC%, automated 0.0 No Ref. Range %    Absolute Nucleated RBC Count 0.00 0.00 - 0.00 x10E3/uL   Expedited COVID-19 and Influenza A B PCR, Respiratory Upper    Collection Time: 12/17/19  9:28 AM    Specimen: Nasopharyngeal; Respiratory, Upper   Result Value Ref Range    Specimen Type Respiratory, Upper     COVID-19 PCR Not Detected Not Detected    Influenza A PCR Not Detected Not Detected    Influenza B PCR Not Detected Not Detected   Urine pregnancy (ICON)    Collection Time: 12/17/19 10:03 AM    Specimen: Clean Catch, Midstream; Urine   Result Value Ref Range    Pregnancy Test,Urine Negative  Drugs of Abuse Scn,Ur    Collection Time: 12/17/19 10:03 AM    Specimen: Clean Catch, Midstream; Urine   Result Value Ref Range    Amphetamine/Meth Positive (A) detection limit at 1000 ng/mL    Barbiturates Negative detection limit at 200 ng/mL    Benzodiazepines Negative detection limit at 200 ng/mL    Cannabinoids Positive (A) detection limit at 50 ng/mL    Cocaine Negative detection limit at 300 ng/mL    Methadone Negative detection limit at 300 ng/mL    Opiates Negative detection limit at 300 ng/mL    Oxycodone Negative detection limit at 100 ng/mL    Ethanol Negative detection limit at 10 mg/dL    Hydrocodone Negative detection limit at 100 ng/mL    Fentanyl Negative detection limit at 1 ng/mL   UA,Dipstick    Collection Time: 12/17/19 10:03 AM    Specimen: Clean Catch, Midstream; Urine   Result Value Ref Range    Urine Color Colorless      Specific Gravity 1.007 1.005 - 1.030    pH,Urine 6.0 5.0 - 8.0    Blood 3+ (A) Negative    Bilirubin Negative Negative    Ketones Negative Negative    Glucose Negative Negative    Protein Negative Negative    Leukocyte Esterase 1+ (A) Negative    Nitrite Negative Negative   UA,Microscopic    Collection Time: 12/17/19 10:03 AM    Specimen: Clean Catch, Midstream; Urine   Result Value Ref Range    RBC per uL 607 (H) 0 - 11 cells/uL    WBC per uL 48 (H) 0 - 22 cells/uL    RBC per HPF 127 (H) 0 - 2 cells/HPF    WBC per HPF 10 (H) 0 - 4 cells/HPF    Bacteria Present (A) Absent    Squamous Epi Cells 8 0 - 17 cells/uL    Hyaline Casts 3-5/LPF (A) 0-2/LPF /LPF       Recent Imaging Results:  No imaging has been resulted in the last 24 hours    Mental Status Examination:   Appearance: 24 y.o. female. In no acute distress, well groomed.   Behavior: Cooperative.   Motor: No psychomotor agitation or retardation. No tremor. No evidence of extrapyramidal signs. Gait was not assessed.  Speech: Normoverbal. Non-pressured.   Mood: ''Tired''  Affect: Affect was euthymic  Thought process: Linear and logical.  Thought content: Notable for vague paranoia . Denies SI/HI  Cognition: Oriented to person, place, time, and situation. Intellectual functioning is average as evidenced by fund of knowledge. Memory is grossly intact as evidenced by ability to engage in an interview.   Insight: Partial insight as evidenced by ability to verbalize an understanding of the symptoms of her illness, its implications, and the need for treatment.   Judgment: Fair judgment as evidenced by medication adherence and ability to engage in discharge planning with team.    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  Significant Psychosocial and Contextual Factors:  Death of infant on 11/05/2019     Assessment and Plan:  24 yo F, no PPHx no PHx, presenting with persecutory delusions that led her to drive her and her two sons from [place] to [place]. Per patient, symptoms reportedly began shortly  after the death of her infant daughter to SIDS in August, complicated by frequent ectasy use. Patient presented dysphoric and paranoid, but is now euthymic, well-related, and linear without evidence of paranoia (at most, possible subtle paranoia). Given UDS positive for amphetamines and cannabis, with known significant substance-use history, I favor meth induced psychosis which now appears to have resolved with only a single dose of olanzapine. Collateral reports at least two years of intermittent paranoia in the context of significant chronic substance use, complicating assessment of substance induced paranoia versus possible underlying primary psychotic disorder. Given degree of reconstitution, patient no longer has indication for LPS hold or psychiatric hospitalization. As patient does not have a safe place to live in [place], we will attempt to refer her to recuperative care with course of low dose olanzapine as a precuation for residual paranoia or amphetamine relapse. Children now with DCFS after report filed based on initial presentation, patient and patient's mother aware and will f/u after discharge. SW has been in contact with DCFS and will update patient as appropriate at time of discharge.   ???  - Inpatient psychiatric admission not planned at this time   - 15 tablets of olanzapine 5mg  at bedtime at discharge for vague residual paranoia  - Anticipated discharge disposition: recuperative care  - The patient was counseled regarding treatment plan and follow-up.  - The patient was strongly counseled to return to the [facility] ED or nearest emergency department (1) should patient's symptoms persist or worsen, (2) should there be any concern for her safety or the safety of others, or (3) should she no longer be able to care for her basic needs or avail herself of adequate assistance.  - Please page [phone] for further questions or concerns. Thank you for involving [facility] in this case.    Note written by Caroline Sauger, PGY-2 psychiatry resident. This patient was evaluated and discussed with Dr. Helyn App, attending psychiatrist, with whom the above assessment and plan were jointly formulated. Updated recommendations and plan were discussed with referring treatment team.         I have evaluated the patient, performed a mental status exam, and agree with the findings and plan as documented in the resident's note. Darliss Ridgel, MD

## 2019-12-18 NOTE — ED Notes
Report given to Kim, RN.

## 2019-12-18 NOTE — ED Notes
Pt pending [facility] acceptance

## 2019-12-18 NOTE — Progress Notes
CSW initiated process for patient to be discharged to [facility]. CSW waiting for response from [facility].

## 2019-12-18 NOTE — Progress Notes
CSW received confirmation that [facility] has accepted patient. [facility] confirmed ETA for patient to be picked up to be 3:30pm. CSW spoke with RN, Marica Otter to confirm discharge plan. CSW also gave patient her prescription for olanzapine.

## 2019-12-18 NOTE — ED Notes
First contacted pt for a discharge, pt has no complaint, pt is calm, all pt belongings sent with pt, pt already has prescription medication

## 2019-12-18 NOTE — ED Notes
Resting in bed asleep, oriented to new bed space, sitter at bedside, continued to monitor for safety

## 2019-12-18 NOTE — ED Notes
Escorted pt to discharge lounge, report given to [employer] as pt is waiting for a ride to go to [facility]upe care ETA 15:30    RE: pt's car, called [employer], pt spoke directly to [employer] officer, pt was advised to have parking staff move her car to the garage (from ED temp parking space)

## 2019-12-18 NOTE — ED Notes
Pt resting in bed, AOX4, speaking in clear and full sentences, tolerating PO intake of lunch, NO SI/HI, voices or delusions.

## 2019-12-19 LAB — Bacterial Culture Urine

## 2020-01-21 ENCOUNTER — Inpatient Hospital Stay
Admit: 2020-01-21 | Discharge: 2020-01-22 | Disposition: A | Discharge status: Home / Self Care | Payer: Medicaid - Out of State | Source: Home / Self Care | Attending: Emergency Medical Services

## 2020-01-21 DIAGNOSIS — Z7729 Contact with and (suspected ) exposure to other hazardous substances: Secondary | ICD-10-CM

## 2020-01-21 LAB — Blood Gases,venous: PCO2,VENOUS: 46 mmHg (ref 37–65)

## 2020-01-21 LAB — CREATININE: CREATININE: 0.8 mg/dL (ref 0.60–1.30)

## 2020-01-21 LAB — Electrolyte Panel: CHLORIDE: 107 mmol/L — ABNORMAL HIGH (ref 96–106)

## 2020-01-21 LAB — CBC: MEAN PLATELET VOLUME: 9.2 fL — ABNORMAL LOW (ref 9.3–13.0)

## 2020-01-21 LAB — Urea Nitrogen: UREA NITROGEN: 7 mg/dL (ref 7–22)

## 2020-01-21 LAB — Hepatic Funct Panel: ALANINE AMINOTRANSFERASE: 13 U/L (ref 8–70)

## 2020-01-21 LAB — Lipase: LIPASE: 28 U/L (ref 13–69)

## 2020-01-21 LAB — Co-Oximetry: HEMOGLOBIN: 14.7 g/dL (ref 0.5–1.5)

## 2020-01-21 LAB — Glucose, Whole Blood: GLUCOSE, WHOLE BLOOD: 89 mg/dL (ref 65–99)

## 2020-01-21 NOTE — ED Triage Notes
PT comes to ER concerned for Carbon Monoxide poisoning. Pt reports living out of car.

## 2020-01-21 NOTE — ED Notes
COLLECTIVE?NOTIFICATION?01/21/2020 12:28?[name], [name]?MRN: [id number]    Criteria Met      2 Visits in 30 Days    Security and Safety  No recent Security Events currently on file    ED Care Guidelines  There are currently no ED Care Guidelines for this patient. Please check your facility's medical records system.        Prescription Drug Report (12 Mo.)  PDMP query found no report.      E.D. Visit Count (12 mo.)  Facility Visits   [facility] 1   [facility] 2   Total 3   Note: Visits indicate total known visits.     Recent Emergency Department Visit Summary  Date Facility [facility] Type Diagnoses or Chief Complaint   Jan 21, 2020 [facility]     Jan 18, 2020 [place]  Chief Complaint: ER EVALUATION_ #    Dec 17, 2019 [facility]      1. Psychiatric Evaluation      1. Brief psychotic disorder          Recent Inpatient Visit Summary  No recorded inpatient visits.     Care Team  There is not a care team on record at this time.   Collective Portal  This patient has registered at the [facility] Emergency Department   For more information visit: [email or web address]   PLEASE NOTE:     1.   Any care recommendations and other clinical information are provided as guidelines or for historical purposes only, and providers should exercise their own clinical judgment when providing care.    2.   You may only use this information for purposes of treatment, payment or health care operations activities, and subject to the limitations of applicable [employer] Policies.    3.   You should consult directly with the organization that provided a care guideline or other clinical history with any questions about additional information or accuracy or completeness of information provided.    ? 2021 [employer] - [email or web address].com/

## 2020-01-21 NOTE — ED Notes
Patient notified of need for urine specimen. RN explained correct procedure to provide specimen, urine cup placed at bedside. Pt verbalized understanding and will provide sample and notify RN as soon as collected.

## 2020-01-21 NOTE — ED Notes
Patient states she was seen for the same at [facility] and they told her she was fine. Patient requesting mental health resources states she wants to be admitted for anxiety and depression .

## 2020-01-22 MED ADMIN — ACETAMINOPHEN 500 MG PO TABS: 1000 mg | ORAL | @ 01:00:00 | Stop: 2020-01-22 | NDC 00904673061

## 2020-01-22 NOTE — ED Provider Notes
[name]  Emergency Department Service Report    Triage     Nyilah Kight, a 24 y.o. female, presents with Dizziness (''there was a smell coming from my vehicle''), Abdominal Pain, and Eye Problem    Arrived on 01/21/2020 at 12:28 PM   Arrived by Walk-in [14]    ED Triage Vitals [01/21/20 1255]   Temp Temp Source BP Heart Rate Resp SpO2 O2 Device Pain Score Weight   37.1 ???C (98.8 ???F) Oral 128/76 79 18 98 % None (Room air) Five --       No Known Allergies     Initial Physician Contact     Medical Screening Exam Initiated     Date/Time Event User Comments    01/21/20 1428 MSE Initiated [place], JAIME [facility]       Medical Screening Exam - MD Comments    Date and Time MSE MD Comment User   01/21/20 1420 Patient reports sleeping in her car and presents for headache, dizziness, and blurry vision x few days.  Patient states symptoms are constant and worsening.  Patient states when she is sleeping in her car she will sometimes turn the car on and reports having falling asleep with the car on, usually the car is in an open parking lot.  No fevers, no head injury.  No eye drainage. Reports burning sensation in left eye. Patient also reports upper abdominal pain and a need to clear her throat. On exam, no focal neuro deficits, heart regular, lungs clear, abdomen soft and nontender. JMJ          Comprehensive Exam Initiated  Contact Date: 01/21/20  Contact Time: 1546    History   HPI  Aarion Casa 24 y.o. female with a history of asthma presents with left sided headaches and abdominal pain for the past week since she began living in her car. Patient explains her abdominal pain as mild, like gas, and above her belly button. Patient endorses symptoms of constipation for the past 3 days as well shortness of breath and chills. Her last bowel movement was 3 days ago. Patient reports she believes she is smelling some gas from her car and is concerned this may be causing her symptoms. She is not taking any medications for her headache. She is eating well. Patient denies dysuria, hematuria, changes in eating, or fevers.     Past Medical History:   Diagnosis Date   ??? Asthma         History reviewed. No pertinent surgical history.     Past Family History   family history is not on file.     Past Social History   she has no history on file for tobacco use, alcohol use, drug use, and sexual activity. Patient is living in her car.      Review of Systems   Constitutional: Positive for chills. Negative for appetite change and fever.   HENT:        Smells chemicals   Respiratory: Positive for shortness of breath.    Gastrointestinal: Positive for abdominal pain.   Genitourinary: Negative for dysuria and hematuria.   All other systems reviewed and are negative.      Physical Exam   Physical Exam  Vitals and nursing note reviewed.   Constitutional:       General: She is not in acute distress.     Appearance: Normal appearance. She is not ill-appearing.   HENT:      Head: Normocephalic  and atraumatic.      Nose: Nose normal.      Mouth/Throat:      Mouth: Mucous membranes are moist.   Eyes:      Extraocular Movements: Extraocular movements intact.      Conjunctiva/sclera: Conjunctivae normal.   Cardiovascular:      Rate and Rhythm: Normal rate and regular rhythm.      Pulses: Normal pulses.      Heart sounds: Normal heart sounds.      Comments: Regular rate and rhythm   Pulmonary:      Effort: Pulmonary effort is normal. No respiratory distress.      Breath sounds: Normal breath sounds.      Comments: Clear to auscultation bilaterally   Abdominal:      General: There is no distension.      Palpations: Abdomen is soft.      Tenderness: There is no abdominal tenderness.      Comments: (+)Diffused nonlocalized abdominal tenderness   Musculoskeletal:         General: No swelling or deformity. Normal range of motion.      Cervical back: Normal range of motion and neck supple.   Skin:     General: Skin is warm and dry.      Findings: No rash.   Neurological:      Mental Status: She is alert and oriented to person, place, and time.   Psychiatric:         Mood and Affect: Mood normal.         Behavior: Behavior normal.       Medical Decision Making   Jozee Snare is a 24 y.o. female with a history of asthma presents with abdominal pain, left sided headaches, and abdominal pain for the past week since she began living in her car.***        Chart Review   Previous medical records requested.  Pertinent items reviewed.    ED Course      Laboratory Results     Labs Reviewed   CO-OXIMETRY - Abnormal; Notable for the following components:       Result Value    Carboxyhemoglobin 6.3 (*)     All other components within normal limits   CBC - Abnormal; Notable for the following components:    White Blood Cell Count 10.63 (*)     Mean Platelet Volume 9.2 (*)     All other components within normal limits   ELECTROLYTE PANEL - Abnormal; Notable for the following components:    Chloride 107 (*)     All other components within normal limits   UREA NITROGEN - Normal   GLUCOSE - Normal   LIPASE - Normal   HEPATIC FUNCT PANEL - Normal   CREATININE,WHOLE BLOOD   BLOOD GASES,VENOUS   PREGNANCY TEST,URINE       Imaging Results     No orders to display       Consults       Time               Service  ***:     {plan; consultations:24168}    Progress Notes / Reassessments     ED Course as of 01/21/20 1608   Thu Jan 21, 2020   1604 Carboxyhemoglobin(!): 6.3 [ZA]      ED Course User Index  [ZA] Tamala Fothergill, MD     ED Procedure Notes     Any ED procedures performed are documented  on separate ED procedure notes.    Clinical Impression   No diagnosis found.    Disposition and Follow-up   Disposition:     No future appointments.    Follow up with:  No follow-up provider specified.    Return precautions are specified on After Visit Summary.    New Prescriptions    No medications on file       Orders Placed This Encounter   ??? Co-Oximetry (RR Only)   ??? CBC without differential ??? Electrolyte Panel (Na, K, Cl, CO2)   ??? Urea Nitrogen   ??? Creatinine   ??? Glucose   ??? Urine pregnancy (ICON)   ??? Blood Gases,venous   ??? Lipase   ??? Hepatic Funct Panel       Scribe Signature   We, Carmelia Roller & Riki Rusk, have acted as a Stage manager for [employer] on behalf of Dr. Tasia Catchings on 01/21/2020 at 4:15 PM.    Physician Signature(s)   *** Please add dotEDRESATT and JG + KT. Thank you!  Marland Kitchen

## 2020-01-22 NOTE — Consults
Patient referred to SW secondary to homelessness, as per [phone] (state law designed to connect homeless individuals with the medical/psychiatric/and social resources they need).     Patient's treatment team has determined that she is alert, oriented, and medically cleared for discharge. Consequently, patient seen by SW, discharge options discussed.  Pt reports she was at [facility] in [place], but because she had covid related symptoms, they were going to refer her to a hotel to quarantine but pt refused and has been sleeping in her car.  Pt's 2 children are still under custody of DCFS.  Patient does not want shelter at this time states she wants to return to her car which is parked in [place] because her case worker at [facility] instructed her to come tomorrow.  Pt requesting transportation to her car.  Arranged Benedetto Goad.      Treatment team aware of above.

## 2020-03-12 NOTE — L&D Delivery Note (Signed)
Delivery Note Labor onset:  12/29/20 Labor Onset Time: 0000 Complete dilation at 3:25 AM  Onset of pushing at 0325 FHR second stage Cat 2 Analgesia/Anesthesia intrapartum: epidural  Guided pushing with maternal urge. Delivery of a viable female at 24. Fetal head delivered in LOA position.  Nuchal cord: none.  Infant placed on maternal abd, dried, and tactile stim.  Cord double clamped after 3 min and cut by Father.   Cord blood sample collected: Yes Arterial cord blood sample collected: Yes for fetal bradycardia  Placenta delivered Tomasa Blase, intact, with 3 VC.  Placenta to L&D. Uterine tone firm, bleeding small, no clots  No laceration identified.  Anesthesia: epidural Repair: N/A QBL/EBL (mL): 300 Complications: Fetal bradycardia APGAR: APGAR (1 MIN): 9   APGAR (5 MINS):  9 APGAR (10 MINS):   Mom to postpartum.  Baby to Couplet care / Skin to Skin  Baby boy "Sage" parents request in-pt circ  Roma Schanz MSN, CNM 12/29/2020, 3:45 AM

## 2020-06-15 ENCOUNTER — Emergency Department (HOSPITAL_COMMUNITY): Payer: Medicaid Other

## 2020-06-15 ENCOUNTER — Other Ambulatory Visit: Payer: Self-pay

## 2020-06-15 ENCOUNTER — Encounter (HOSPITAL_COMMUNITY): Payer: Self-pay

## 2020-06-15 ENCOUNTER — Emergency Department (HOSPITAL_COMMUNITY)
Admission: EM | Admit: 2020-06-15 | Discharge: 2020-06-15 | Disposition: A | Discharge status: Home / Self Care | Payer: Medicaid Other | Attending: Emergency Medicine | Admitting: Emergency Medicine

## 2020-06-15 DIAGNOSIS — R1030 Lower abdominal pain, unspecified: Secondary | ICD-10-CM | POA: Diagnosis not present

## 2020-06-15 DIAGNOSIS — O26891 Other specified pregnancy related conditions, first trimester: Secondary | ICD-10-CM | POA: Diagnosis not present

## 2020-06-15 DIAGNOSIS — Z3A11 11 weeks gestation of pregnancy: Secondary | ICD-10-CM | POA: Insufficient documentation

## 2020-06-15 LAB — URINALYSIS, ROUTINE W REFLEX MICROSCOPIC
Bilirubin Urine: NEGATIVE
Glucose, UA: NEGATIVE mg/dL
Hgb urine dipstick: NEGATIVE
Ketones, ur: NEGATIVE mg/dL
Leukocytes,Ua: NEGATIVE
Nitrite: NEGATIVE
Protein, ur: NEGATIVE mg/dL
Specific Gravity, Urine: 1.015 (ref 1.005–1.030)
pH: 7 (ref 5.0–8.0)

## 2020-06-15 LAB — BASIC METABOLIC PANEL
Anion gap: 6 (ref 5–15)
BUN: 7 mg/dL (ref 6–20)
CO2: 22 mmol/L (ref 22–32)
Calcium: 9 mg/dL (ref 8.9–10.3)
Chloride: 106 mmol/L (ref 98–111)
Creatinine, Ser: 0.42 mg/dL — ABNORMAL LOW (ref 0.44–1.00)
GFR, Estimated: >60 mL/min (ref >60)
Glucose, Bld: 85 mg/dL (ref 70–99)
Potassium: 3.9 mmol/L (ref 3.5–5.1)
Sodium: 134 mmol/L — ABNORMAL LOW (ref 135–145)

## 2020-06-15 LAB — CBC WITH DIFFERENTIAL/PLATELET
Abs Immature Granulocytes: 0.03 10*3/uL (ref 0.00–0.07)
Basophils Absolute: 0 10*3/uL (ref 0.0–0.1)
Basophils Relative: 0 %
Eosinophils Absolute: 0.2 10*3/uL (ref 0.0–0.5)
Eosinophils Relative: 3 %
HCT: 37.3 % (ref 36.0–46.0)
Hemoglobin: 12.5 g/dL (ref 12.0–15.0)
Immature Granulocytes: 0 %
Lymphocytes Relative: 24 %
Lymphs Abs: 2 10*3/uL (ref 0.7–4.0)
MCH: 29.4 pg (ref 26.0–34.0)
MCHC: 33.5 g/dL (ref 30.0–36.0)
MCV: 87.8 fL (ref 80.0–100.0)
Monocytes Absolute: 0.7 10*3/uL (ref 0.1–1.0)
Monocytes Relative: 8 %
Neutro Abs: 5.3 10*3/uL (ref 1.7–7.7)
Neutrophils Relative %: 65 %
Platelets: 222 10*3/uL (ref 150–400)
RBC: 4.25 MIL/uL (ref 3.87–5.11)
RDW: 13.7 % (ref 11.5–15.5)
WBC: 8.3 10*3/uL (ref 4.0–10.5)
nRBC: 0 % (ref 0.0–0.2)

## 2020-06-15 LAB — HCG, QUANTITATIVE, PREGNANCY: hCG, Beta Chain, Quant, S: 84152 m[IU]/mL — ABNORMAL HIGH (ref <5)

## 2020-06-15 LAB — PREGNANCY, URINE: Preg Test, Ur: POSITIVE — AB

## 2020-06-15 MED ORDER — SODIUM CHLORIDE 0.9 % IV BOLUS
1000.0000 mL | Freq: Once | INTRAVENOUS | Status: AC
  Administered 2020-06-15: 1000 mL via INTRAVENOUS

## 2020-06-15 MED ORDER — ACETAMINOPHEN 325 MG PO TABS
650.0000 mg | ORAL_TABLET | Freq: Once | ORAL | Status: AC
  Administered 2020-06-15: 650 mg via ORAL
  Filled 2020-06-15: qty 2

## 2020-06-15 NOTE — ED Triage Notes (Signed)
Pt c/o dizziness, and abd cramping coming and going x4-5 days. States [redacted] wks pregnant. Denies any bleeding, n/v or diarrhea.

## 2020-06-15 NOTE — Discharge Instructions (Addendum)
You have been seen and discharged from the emergency department.  Follow-up with your OB/GYN doctor for further prenatal testing and care.  Take prenatal vitamins.  If you have any worsening symptoms, vaginal bleeding, vaginal discharge, severe pelvic pain or further concerns for health please return to an emergency department for further evaluation.

## 2020-06-15 NOTE — ED Provider Notes (Signed)
[facility]-EMERGENCY DEPT Provider Note   CSN: [id number] Arrival date & time: 06/15/20  1013     History Chief Complaint  Patient presents with  . Abdominal Cramping    Nicole Carson is a 25 y.o. female.  HPI   25 year old female presents to the ER with concern for home pregnancy test and mild lower abdominal cramping.  This would be the patient's fifth pregnancy, her first 4 were uncomplicated.  Patient estimates that she is around 11 to [redacted] weeks pregnant, she has not seen her OB/GYN or had an ultrasound.  She is complaining of lower abdominal cramping, no unilateral pain, onset was gradual.  Denies any nausea/vomiting/diarrhea/vaginal bleeding/vaginal discharge/vaginal pain.   History reviewed. No pertinent past medical history.  There are no problems to display for this patient.   OB History    Gravida  1   Para      Term      Preterm      AB      Living        SAB      IAB      Ectopic      Multiple      Live Births              History reviewed. No pertinent family history.     Home Medications Prior to Admission medications   Not on File    Allergies    Patient has no known allergies.  Review of Systems   Review of Systems  Constitutional: Negative for chills and fever.  HENT: Negative for congestion.   Eyes: Negative for visual disturbance.  Respiratory: Negative for shortness of breath.   Cardiovascular: Negative for chest pain.  Gastrointestinal: Positive for abdominal pain. Negative for blood in stool, diarrhea, nausea and vomiting.  Genitourinary: Negative for dysuria, hematuria, vaginal bleeding, vaginal discharge and vaginal pain.       + home pregnancy test  Skin: Negative for rash.  Neurological: Negative for headaches.    Physical Exam Updated Vital Signs BP (!) 98/52   Pulse 73   Temp 99.3 F (37.4 C) (Oral)   Resp 16   SpO2 99%   Physical Exam Vitals and nursing note reviewed.   Constitutional:      Appearance: Normal appearance.  HENT:     Head: Normocephalic.     Mouth/Throat:     Mouth: Mucous membranes are moist.  Cardiovascular:     Rate and Rhythm: Normal rate.  Pulmonary:     Effort: Pulmonary effort is normal. No respiratory distress.  Abdominal:     Palpations: Abdomen is soft.     Tenderness: There is no abdominal tenderness. There is no guarding or rebound.  Musculoskeletal:        General: No swelling.  Skin:    General: Skin is warm.  Neurological:     Mental Status: She is alert and oriented to person, place, and time. Mental status is at baseline.  Psychiatric:        Mood and Affect: Mood normal.     ED Results / Procedures / Treatments   Labs (all labs ordered are listed, but only abnormal results are displayed) Labs Reviewed  PREGNANCY, URINE  HCG, QUANTITATIVE, PREGNANCY  CBC WITH DIFFERENTIAL/PLATELET  BASIC METABOLIC PANEL  URINALYSIS, ROUTINE W REFLEX MICROSCOPIC    EKG None  Radiology No results found.  Procedures Procedures   Medications Ordered in ED Medications  sodium chloride 0.9 %  bolus 1,000 mL (has no administration in time range)  acetaminophen (TYLENOL) tablet 650 mg (has no administration in time range)    ED Course  I have reviewed the triage vital signs and the nursing notes.  Pertinent labs & imaging results that were available during my care of the patient were reviewed by me and considered in my medical decision making (see chart for details).    MDM Rules/Calculators/A&P                          25 year old female presents the emergency department with concern for positive pregnancy test and lower abdominal cramping.  Denies any vaginal bleeding/discharge or GU symptoms.  Pregnancy test here is positive, blood work and hCG quant is pending, will perform ultrasound.  Blood work is reassuring, hCG is over 84,000, urinalysis is unremarkable.  Transvaginal ultrasound identifies an intrauterine  gestation approximately 11 weeks.  They were not able to identify either ovary but very low suspicion for ovarian torsion given her current symptoms and presentation.  Patient has a follow-up appointment with her OB/GYN.  I discussed strict return to ED instructions including vaginal bleeding, vaginal discharge, unilateral discomfort, fever.  Patient will be discharged and treated as an outpatient.  Discharge plan and strict return to ED precautions discussed, patient verbalizes understanding and agreement.  Final Clinical Impression(s) / ED Diagnoses Final diagnoses:  None    Rx / DC Orders ED Discharge Orders    None       Rozelle Logan, DO 06/15/20 1429

## 2020-08-11 LAB — OB RESULTS CONSOLE HIV ANTIBODY (ROUTINE TESTING): HIV: NONREACTIVE

## 2020-08-11 LAB — OB RESULTS CONSOLE ABO/RH: RH Type: POSITIVE

## 2020-08-11 LAB — OB RESULTS CONSOLE HEPATITIS B SURFACE ANTIGEN: Hepatitis B Surface Ag: NEGATIVE

## 2020-08-11 LAB — OB RESULTS CONSOLE ANTIBODY SCREEN: Antibody Screen: NEGATIVE

## 2020-08-11 LAB — OB RESULTS CONSOLE RPR: RPR: NONREACTIVE

## 2020-08-11 LAB — OB RESULTS CONSOLE RUBELLA ANTIBODY, IGM: Rubella: IMMUNE

## 2020-08-18 LAB — OB RESULTS CONSOLE GC/CHLAMYDIA
Chlamydia: NEGATIVE
Gonorrhea: NEGATIVE

## 2020-11-07 ENCOUNTER — Encounter (HOSPITAL_COMMUNITY): Payer: Self-pay

## 2020-11-07 ENCOUNTER — Other Ambulatory Visit: Payer: Self-pay

## 2020-11-07 ENCOUNTER — Emergency Department (HOSPITAL_COMMUNITY)
Admission: EM | Admit: 2020-11-07 | Discharge: 2020-11-07 | Disposition: A | Discharge status: Home / Self Care | Payer: Medicaid Other | Attending: Emergency Medicine | Admitting: Emergency Medicine

## 2020-11-07 DIAGNOSIS — R220 Localized swelling, mass and lump, head: Secondary | ICD-10-CM | POA: Insufficient documentation

## 2020-11-07 DIAGNOSIS — R21 Rash and other nonspecific skin eruption: Secondary | ICD-10-CM | POA: Diagnosis not present

## 2020-11-07 DIAGNOSIS — Z5321 Procedure and treatment not carried out due to patient leaving prior to being seen by health care provider: Secondary | ICD-10-CM | POA: Insufficient documentation

## 2020-11-07 NOTE — ED Provider Notes (Signed)
Emergency Medicine Provider Triage Evaluation Note  Nicole Carson , a 25 y.o. female  was evaluated in triage.  Pt complains of swellen areas to face where she thinks she was bit by insects.  Review of Systems  Positive: Swollen areas to face Negative: sob  Physical Exam  BP (!) 135/95 (BP Location: Left Arm)   Pulse 90   Temp 99 F (37.2 C)   Resp 20   SpO2 97%  Gen:   Awake, no distress   Resp:  Normal effort  MSK:   Moves extremities without difficulty  Other:  No angioedema  Medical Decision Making  Medically screening exam initiated at 4:27 PM.  Appropriate orders placed.  Nicole Carson was informed that the remainder of the evaluation will be completed by another provider, this initial triage assessment does not replace that evaluation, and the importance of remaining in the ED until their evaluation is complete.     [name] 11/07/20 1627    Lorre Nick, MD 11/08/20 [phone]

## 2020-11-07 NOTE — ED Triage Notes (Addendum)
Patient has facial swelling, rash on her face since yesterday. Patient thought she had mosquito bites.  Patient denies any problems with swallowing or breathing.  Patient added that she is 31 weeks and 4 days pregnant

## 2020-12-12 LAB — OB RESULTS CONSOLE GBS: GBS: POSITIVE

## 2020-12-22 ENCOUNTER — Other Ambulatory Visit: Payer: Self-pay | Admitting: Obstetrics & Gynecology

## 2020-12-25 ENCOUNTER — Inpatient Hospital Stay (HOSPITAL_COMMUNITY)
Admission: AD | Admit: 2020-12-25 | Discharge: 2020-12-25 | Disposition: A | Discharge status: Home / Self Care | Payer: Medicaid Other | Attending: Obstetrics & Gynecology | Admitting: Obstetrics & Gynecology

## 2020-12-25 ENCOUNTER — Other Ambulatory Visit: Payer: Self-pay

## 2020-12-25 DIAGNOSIS — Z0371 Encounter for suspected problem with amniotic cavity and membrane ruled out: Secondary | ICD-10-CM | POA: Diagnosis not present

## 2020-12-25 DIAGNOSIS — Z3A38 38 weeks gestation of pregnancy: Secondary | ICD-10-CM | POA: Diagnosis not present

## 2020-12-25 DIAGNOSIS — O26893 Other specified pregnancy related conditions, third trimester: Secondary | ICD-10-CM | POA: Diagnosis present

## 2020-12-25 DIAGNOSIS — Z3689 Encounter for other specified antenatal screening: Secondary | ICD-10-CM

## 2020-12-25 NOTE — MAU Note (Signed)
Pt here stating her water broke at 11am on 10/15, clear fluid. Denies any complications with pregnancy. Endorses positive FM.

## 2020-12-25 NOTE — MAU Provider Note (Signed)
S: Nicole Carson is a 25 y.o. G1P0 at [redacted]w[redacted]d  who presents to MAU today complaining of leaking of fluid since 1100. She denies vaginal bleeding. She endorses contractions. She reports normal fetal movement.  She endorses recent sexual activity  O: BP 119/64 (BP Location: Right Arm)   Pulse 86   Temp 98.4 F (36.9 C) (Oral)   Resp 18   Ht 5\' 6"  (1.676 m)   Wt 125.2 kg   SpO2 99%   BMI 44.55 kg/m  GENERAL: Well-developed, well-nourished female in no acute distress.  HEAD: Normocephalic, atraumatic.  CHEST: Normal effort of breathing, regular heart rate ABDOMEN: Soft, nontender, gravid PELVIC: Normal external female genitalia. Vagina is pink and rugated. Cervix with normal contour, no lesions. Normal discharge.  Negative pooling. Fern Collected.  Cervical exam:   Deferred   Fetal Monitoring: FHT: 145 bpm, Mod Var, -Decels, +Accels Toco: 2 contractions noted  No results found for this or any previous visit (from the past 24 hour(s)).   A: SIUP at [redacted]w[redacted]d  Membranes intact Cat I FT  P: -Informed patient of negative fern -Reassured that "gush" could of been discharge, urine, or from recent sexual activity. -NST Reactive -Encouraged to go home and monitor. -Encouraged to call primary office or return to MAU if symptoms worsen or with the onset of new symptoms. -Discharged to home in stable condition.   [redacted]w[redacted]d, CNM 12/25/2020 2:19 AM

## 2020-12-27 ENCOUNTER — Encounter (HOSPITAL_COMMUNITY): Payer: Self-pay | Admitting: *Deleted

## 2020-12-28 ENCOUNTER — Encounter (HOSPITAL_COMMUNITY): Payer: Self-pay | Admitting: Obstetrics and Gynecology

## 2020-12-28 ENCOUNTER — Inpatient Hospital Stay (HOSPITAL_COMMUNITY)
Admission: AD | Admit: 2020-12-28 | Discharge: 2020-12-30 | DRG: 807 | Disposition: A | Discharge status: Home / Self Care | Payer: Medicaid Other | Attending: Obstetrics & Gynecology | Admitting: Obstetrics & Gynecology

## 2020-12-28 DIAGNOSIS — Z3A39 39 weeks gestation of pregnancy: Secondary | ICD-10-CM

## 2020-12-28 DIAGNOSIS — F17200 Nicotine dependence, unspecified, uncomplicated: Secondary | ICD-10-CM | POA: Diagnosis present

## 2020-12-28 DIAGNOSIS — Z3A00 Weeks of gestation of pregnancy not specified: Secondary | ICD-10-CM | POA: Diagnosis not present

## 2020-12-28 DIAGNOSIS — O329XX0 Maternal care for malpresentation of fetus, unspecified, not applicable or unspecified: Secondary | ICD-10-CM

## 2020-12-28 DIAGNOSIS — B951 Streptococcus, group B, as the cause of diseases classified elsewhere: Secondary | ICD-10-CM | POA: Diagnosis present

## 2020-12-28 DIAGNOSIS — O99824 Streptococcus B carrier state complicating childbirth: Secondary | ICD-10-CM | POA: Diagnosis present

## 2020-12-28 DIAGNOSIS — O99214 Obesity complicating childbirth: Principal | ICD-10-CM | POA: Diagnosis present

## 2020-12-28 DIAGNOSIS — F17210 Nicotine dependence, cigarettes, uncomplicated: Secondary | ICD-10-CM | POA: Diagnosis present

## 2020-12-28 DIAGNOSIS — Z20822 Contact with and (suspected) exposure to covid-19: Secondary | ICD-10-CM | POA: Diagnosis present

## 2020-12-28 DIAGNOSIS — O26893 Other specified pregnancy related conditions, third trimester: Secondary | ICD-10-CM | POA: Diagnosis present

## 2020-12-28 DIAGNOSIS — O99334 Smoking (tobacco) complicating childbirth: Secondary | ICD-10-CM | POA: Diagnosis present

## 2020-12-28 HISTORY — DX: Unspecified asthma, uncomplicated: J45.909

## 2020-12-28 LAB — CBC
HCT: 32.4 % — ABNORMAL LOW (ref 36.0–46.0)
Hemoglobin: 10.9 g/dL — ABNORMAL LOW (ref 12.0–15.0)
MCH: 29 pg (ref 26.0–34.0)
MCHC: 33.6 g/dL (ref 30.0–36.0)
MCV: 86.2 fL (ref 80.0–100.0)
Platelets: 245 10*3/uL (ref 150–400)
RBC: 3.76 MIL/uL — ABNORMAL LOW (ref 3.87–5.11)
RDW: 12.8 % (ref 11.5–15.5)
WBC: 8.2 10*3/uL (ref 4.0–10.5)
nRBC: 0 % (ref 0.0–0.2)

## 2020-12-28 LAB — TYPE AND SCREEN
ABO/RH(D): O POS
Antibody Screen: NEGATIVE

## 2020-12-28 LAB — RESP PANEL BY RT-PCR (FLU A&B, COVID) ARPGX2
Influenza A by PCR: NEGATIVE
Influenza B by PCR: NEGATIVE
SARS Coronavirus 2 by RT PCR: NEGATIVE

## 2020-12-28 LAB — POCT FERN TEST: POCT Fern Test: POSITIVE

## 2020-12-28 MED ORDER — TERBUTALINE SULFATE 1 MG/ML IJ SOLN: 0.2500 mg | Freq: Once | INTRAMUSCULAR | Status: DC | PRN

## 2020-12-28 MED ORDER — EPHEDRINE 5 MG/ML INJ: 10.0000 mg | INTRAVENOUS | Status: DC | PRN

## 2020-12-28 MED ORDER — LACTATED RINGERS IV SOLN: 500.0000 mL | INTRAVENOUS | Status: DC | PRN

## 2020-12-28 MED ORDER — OXYTOCIN-SODIUM CHLORIDE 30-0.9 UT/500ML-% IV SOLN
2.5000 [IU]/h | INTRAVENOUS | Status: DC
  Administered 2020-12-29: 2.5 [IU]/h via INTRAVENOUS

## 2020-12-28 MED ORDER — DIPHENHYDRAMINE HCL 50 MG/ML IJ SOLN: 12.5000 mg | INTRAMUSCULAR | Status: DC | PRN

## 2020-12-28 MED ORDER — PHENYLEPHRINE 40 MCG/ML (10ML) SYRINGE FOR IV PUSH (FOR BLOOD PRESSURE SUPPORT): 80.0000 ug | PREFILLED_SYRINGE | INTRAVENOUS | Status: DC | PRN

## 2020-12-28 MED ORDER — PENICILLIN G POT IN DEXTROSE 60000 UNIT/ML IV SOLN
3.0000 10*6.[IU] | INTRAVENOUS | Status: DC
  Administered 2020-12-29: 3 10*6.[IU] via INTRAVENOUS
  Filled 2020-12-28: qty 50

## 2020-12-28 MED ORDER — PENICILLIN G POTASSIUM 5000000 UNITS IJ SOLR
5.0000 10*6.[IU] | Freq: Once | INTRAMUSCULAR | Status: AC
  Administered 2020-12-28: 5 10*6.[IU] via INTRAVENOUS
  Filled 2020-12-28: qty 5

## 2020-12-28 MED ORDER — LACTATED RINGERS IV SOLN
500.0000 mL | Freq: Once | INTRAVENOUS | Status: AC
  Administered 2020-12-28: 500 mL via INTRAVENOUS

## 2020-12-28 MED ORDER — LACTATED RINGERS IV SOLN: INTRAVENOUS | Status: DC

## 2020-12-28 MED ORDER — SOD CITRATE-CITRIC ACID 500-334 MG/5ML PO SOLN
30.0000 mL | ORAL | Status: DC | PRN
  Administered 2020-12-28: 30 mL via ORAL
  Filled 2020-12-28: qty 30

## 2020-12-28 MED ORDER — FENTANYL-BUPIVACAINE-NACL 0.5-0.125-0.9 MG/250ML-% EP SOLN
12.0000 mL/h | EPIDURAL | Status: DC | PRN
  Administered 2020-12-29: 12 mL/h via EPIDURAL
  Filled 2020-12-28: qty 250

## 2020-12-28 MED ORDER — OXYTOCIN-SODIUM CHLORIDE 30-0.9 UT/500ML-% IV SOLN
1.0000 m[IU]/min | INTRAVENOUS | Status: DC
Start: 2020-12-28 — End: 2020-12-29
  Administered 2020-12-28: 2 m[IU]/min via INTRAVENOUS
  Filled 2020-12-28: qty 500

## 2020-12-28 MED ORDER — LIDOCAINE HCL (PF) 1 % IJ SOLN: 30.0000 mL | INTRAMUSCULAR | Status: DC | PRN

## 2020-12-28 MED ORDER — ONDANSETRON HCL 4 MG/2ML IJ SOLN: 4.0000 mg | Freq: Four times a day (QID) | INTRAMUSCULAR | Status: DC | PRN

## 2020-12-28 MED ORDER — OXYTOCIN BOLUS FROM INFUSION
333.0000 mL | Freq: Once | INTRAVENOUS | Status: AC
  Administered 2020-12-29: 333 mL via INTRAVENOUS

## 2020-12-28 MED ORDER — ACETAMINOPHEN 325 MG PO TABS
650.0000 mg | ORAL_TABLET | ORAL | Status: DC | PRN
  Administered 2020-12-29: 650 mg via ORAL
  Filled 2020-12-28: qty 2

## 2020-12-28 MED ORDER — PHENYLEPHRINE 40 MCG/ML (10ML) SYRINGE FOR IV PUSH (FOR BLOOD PRESSURE SUPPORT)
80.0000 ug | PREFILLED_SYRINGE | INTRAVENOUS | Status: DC | PRN
  Administered 2020-12-29 (×2): 80 ug via INTRAVENOUS

## 2020-12-28 NOTE — MAU Note (Signed)
.  Nicole Carson is a 25 y.o. at [redacted]w[redacted]d here in MAU reporting: SROM at 0630 this morning, clear fluid. Has some bloody show when she wipes. Occasional ctx. Reports baby is not as active as normal today. GBS pos.   Vitals:   12/28/20 1734  BP: (!) 150/72  Pulse: (!) 101  Resp: 15  Temp: 98.6 F (37 C)  SpO2: 98%

## 2020-12-28 NOTE — Progress Notes (Signed)
Vtx by [facility] 

## 2020-12-29 ENCOUNTER — Inpatient Hospital Stay (HOSPITAL_COMMUNITY): Payer: Medicaid Other | Admitting: Anesthesiology

## 2020-12-29 ENCOUNTER — Encounter (HOSPITAL_COMMUNITY): Payer: Self-pay | Admitting: Obstetrics & Gynecology

## 2020-12-29 ENCOUNTER — Inpatient Hospital Stay (HOSPITAL_COMMUNITY): Payer: Medicaid Other

## 2020-12-29 ENCOUNTER — Inpatient Hospital Stay (HOSPITAL_COMMUNITY)
Admission: AD | Admit: 2020-12-29 | Payer: Medicaid Other | Source: Home / Self Care | Admitting: Obstetrics & Gynecology

## 2020-12-29 DIAGNOSIS — B951 Streptococcus, group B, as the cause of diseases classified elsewhere: Secondary | ICD-10-CM | POA: Diagnosis present

## 2020-12-29 DIAGNOSIS — F17200 Nicotine dependence, unspecified, uncomplicated: Secondary | ICD-10-CM | POA: Diagnosis present

## 2020-12-29 LAB — RPR: RPR Ser Ql: NONREACTIVE

## 2020-12-29 MED ORDER — TRANEXAMIC ACID-NACL 1000-0.7 MG/100ML-% IV SOLN
INTRAVENOUS | Status: AC
  Filled 2020-12-29: qty 100

## 2020-12-29 MED ORDER — ONDANSETRON HCL 4 MG/2ML IJ SOLN: 4.0000 mg | INTRAMUSCULAR | Status: DC | PRN

## 2020-12-29 MED ORDER — ONDANSETRON HCL 4 MG PO TABS: 4.0000 mg | ORAL_TABLET | ORAL | Status: DC | PRN

## 2020-12-29 MED ORDER — TRANEXAMIC ACID-NACL 1000-0.7 MG/100ML-% IV SOLN
1000.0000 mg | INTRAVENOUS | Status: AC
  Administered 2020-12-29: 1000 mg via INTRAVENOUS

## 2020-12-29 MED ORDER — COCONUT OIL OIL: 1.0000 "application " | TOPICAL_OIL | Status: DC | PRN

## 2020-12-29 MED ORDER — SIMETHICONE 80 MG PO CHEW: 80.0000 mg | CHEWABLE_TABLET | ORAL | Status: DC | PRN

## 2020-12-29 MED ORDER — TETANUS-DIPHTH-ACELL PERTUSSIS 5-2.5-18.5 LF-MCG/0.5 IM SUSY: 0.5000 mL | PREFILLED_SYRINGE | Freq: Once | INTRAMUSCULAR | Status: DC

## 2020-12-29 MED ORDER — DIPHENHYDRAMINE HCL 25 MG PO CAPS: 25.0000 mg | ORAL_CAPSULE | Freq: Four times a day (QID) | ORAL | Status: DC | PRN

## 2020-12-29 MED ORDER — PRENATAL MULTIVITAMIN CH
1.0000 | ORAL_TABLET | Freq: Every day | ORAL | Status: DC
  Administered 2020-12-29 – 2020-12-30 (×2): 1 via ORAL
  Filled 2020-12-29 (×2): qty 1

## 2020-12-29 MED ORDER — WITCH HAZEL-GLYCERIN EX PADS: 1.0000 "application " | MEDICATED_PAD | CUTANEOUS | Status: DC | PRN

## 2020-12-29 MED ORDER — IBUPROFEN 600 MG PO TABS
600.0000 mg | ORAL_TABLET | Freq: Four times a day (QID) | ORAL | Status: DC
  Administered 2020-12-29 – 2020-12-30 (×6): 600 mg via ORAL
  Filled 2020-12-29 (×6): qty 1

## 2020-12-29 MED ORDER — SENNOSIDES-DOCUSATE SODIUM 8.6-50 MG PO TABS
2.0000 | ORAL_TABLET | ORAL | Status: DC
  Administered 2020-12-29: 2 via ORAL
  Filled 2020-12-29: qty 2

## 2020-12-29 MED ORDER — DIBUCAINE (PERIANAL) 1 % EX OINT: 1.0000 "application " | TOPICAL_OINTMENT | CUTANEOUS | Status: DC | PRN

## 2020-12-29 MED ORDER — BENZOCAINE-MENTHOL 20-0.5 % EX AERO: 1.0000 "application " | INHALATION_SPRAY | CUTANEOUS | Status: DC | PRN

## 2020-12-29 MED ORDER — LIDOCAINE HCL (PF) 1 % IJ SOLN
INTRAMUSCULAR | Status: DC | PRN
  Administered 2020-12-29: 10 mL via EPIDURAL

## 2020-12-29 MED ORDER — ACETAMINOPHEN 325 MG PO TABS
650.0000 mg | ORAL_TABLET | ORAL | Status: DC | PRN
  Administered 2020-12-29 (×2): 650 mg via ORAL
  Filled 2020-12-29 (×2): qty 2

## 2020-12-29 NOTE — Plan of Care (Signed)

## 2020-12-29 NOTE — Anesthesia Procedure Notes (Signed)
Epidural Patient location during procedure: OB Start time: 12/29/2020 12:09 AM End time: 12/29/2020 12:27 AM  Staffing Anesthesiologist: Lucretia Kern, MD Performed: anesthesiologist   Preanesthetic Checklist Completed: patient identified, IV checked, risks and benefits discussed, monitors and equipment checked, pre-op evaluation and timeout performed  Epidural Patient position: sitting Prep: DuraPrep Patient monitoring: heart rate, continuous pulse ox and blood pressure Approach: midline Location: L3-L4 Injection technique: LOR air  Needle:  Needle type: Tuohy  Needle gauge: 17 G Needle length: 9 cm Needle insertion depth: 7 cm Catheter type: closed end flexible Catheter size: 19 Gauge Catheter at skin depth: 12 cm Test dose: negative  Assessment Events: blood not aspirated, injection not painful, no injection resistance, no paresthesia and negative IV test  Additional Notes Reason for block:procedure for pain

## 2020-12-29 NOTE — Anesthesia Preprocedure Evaluation (Signed)
Anesthesia Evaluation  Patient identified by MRN, date of birth, ID band Patient awake    Reviewed: Allergy & Precautions, H&P , NPO status , Patient's Chart, lab work & pertinent test results  History of Anesthesia Complications Negative for: history of anesthetic complications  Airway Mallampati: II  TM Distance: >3 FB     Dental   Pulmonary asthma , Current Smoker,    Pulmonary exam normal        Cardiovascular negative cardio ROS   Rhythm:regular Rate:Normal     Neuro/Psych negative neurological ROS  negative psych ROS   GI/Hepatic negative GI ROS, Neg liver ROS,   Endo/Other  Morbid obesity  Renal/GU      Musculoskeletal   Abdominal   Peds  Hematology negative hematology ROS (+)   Anesthesia Other Findings   Reproductive/Obstetrics (+) Pregnancy                             Anesthesia Physical Anesthesia Plan  ASA: 3  Anesthesia Plan: Epidural   Post-op Pain Management:    Induction:   PONV Risk Score and Plan:   Airway Management Planned:   Additional Equipment:   Intra-op Plan:   Post-operative Plan:   Informed Consent: I have reviewed the patients History and Physical, chart, labs and discussed the procedure including the risks, benefits and alternatives for the proposed anesthesia with the patient or authorized representative who has indicated his/her understanding and acceptance.       Plan Discussed with:   Anesthesia Plan Comments:         Anesthesia Quick Evaluation

## 2020-12-29 NOTE — H&P (Signed)
OB ADMISSION/ HISTORY & PHYSICAL:  Admission Date: 12/28/2020  5:18 PM  Admit Diagnosis: Normal labor  Alita Rauh is a 25 y.o. female [id number] 110w0d presenting for LOF since 0630. Endorses active FM, denies vaginal bleeding. Hx of birth @ 26 wks of infant w/ Trisomy 27, infant passed an hour after birth. Last birth in 06/30/19 infant died of SIDS @ 4 months. Late entry to care @ 19 wks.   History of current pregnancy: [id number]   Patient entered care with [facility] at 19 wks.   EDC 01/05/21 by [place] @ 10+6 wk    Anatomy scan:  17, 21, and 27 wks with limited views R/T maternal habitus. 27 wk scan is complete w/ exceptions for sub-optimal views of the spine, posterior placenta.   Antenatal testing: for pre-preg BMI 43.1 started at 28 weeks Last evaluation: 32+6 wks BPP 8/8 vertex/ posterior placenta/ EFW 4# 7oz (32%)/ AFI 13  Significant prenatal events:  Patient Active Problem List   Diagnosis Date Noted   Smoker unmotivated to quit 12/29/2020   Normal labor 12/29/2020   Group beta Strep positive 12/29/2020   Morbid obesity with body mass index (BMI) of 40.0 or higher (HCC) 12/28/2020    Prenatal Labs: ABO, Rh: --/--/O POS (10/19 1743) Antibody: NEG (10/19 1743) Rubella: Immune (06/02 0000)  RPR: Nonreactive (06/02 0000)  HBsAg: Negative (06/02 0000)  HIV: Non-reactive (06/02 0000)  GTT: normal 1 hr GBS: Positive/-- (10/03 0000)  GC/CHL: neg/neg Genetics: low-risk female, Horizon neg Tdap/influenza vaccines: declined tdap and flu   OB History  Gravida Para Term Preterm AB Living  5 4 3 1   2   SAB IAB Ectopic Multiple Live Births          4    # Outcome Date GA Lbr Len/2nd Weight Sex Delivery Anes PTL Lv  5 Current           4 Term 06/08/19 [redacted]w[redacted]d  2266/06/30 g F Vag-Spont  Y DEC     Birth Comments: SIDS, baby passed away at 37 months old & cause was listed as SIDS  3 Term 07/13/16 [redacted]w[redacted]d  2776-06-29 g M Vag-Spont  N LIV  2 Preterm 05/03/15 [redacted]w[redacted]d   F Vag-Spont  Y ND     Birth Comments: baby born  with Trisomy 76, lived 1 hour after birth     Complications: Trisomy 18  1 Term 10/24/12 [redacted]w[redacted]d  June 29, 2776 g M Vag-Spont   LIV    Medical / Surgical History: Past medical history:  Past Medical History:  Diagnosis Date   Asthma     Past surgical history: History reviewed. No pertinent surgical history. Family History: History reviewed. No pertinent family history.  Social History:  reports that she has been smoking cigarettes. She has been smoking an average of .5 packs per day. She has never used smokeless tobacco. She reports that she does not currently use alcohol. She reports current drug use. Drug: Marijuana.  Allergies: Patient has no known allergies.   Current Medications at time of admission:  Prior to Admission medications   Medication Sig Start Date End Date Taking? Authorizing Provider  Prenatal Vit-Fe Fumarate-FA (PRENATAL MULTIVITAMIN) TABS tablet Take 1 tablet by mouth daily at 12 noon.   Yes [provider]    Review of Systems: Constitutional: Negative   HENT: Negative   Eyes: Negative   Respiratory: Negative   Cardiovascular: Negative   Gastrointestinal: Negative  Genitourinary: neg for bloody show, pos for LOF   Musculoskeletal:  Negative   Skin: Negative   Neurological: Negative   Endo/Heme/Allergies: Negative   Psychiatric/Behavioral: Negative    Physical Exam: VS: Blood pressure 109/60, pulse 77, temperature 98.1 F (36.7 C), temperature source Oral, resp. rate 16, weight 126.5 kg, SpO2 99 %. AAO x3, no signs of distress Cardiovascular: RRR Respiratory: Lung fields clear to ausculation GU/GI: Abdomen gravid, non-tender, non-distended, active FM, vertex Extremities: trace edema, negative for pain, tenderness, and cords  Cervical exam:Dilation: 3 Effacement (%): 60 Station: -3 Exam by:: Amado Nash, RN FHR: baseline rate 145 / variability moderate / accelerations present / absent decelerations TOCO: occasional    Prenatal Transfer Tool   Maternal Diabetes: No Genetic Screening: Normal Maternal Ultrasounds/Referrals: Normal Fetal Ultrasounds or other Referrals:  None Maternal Substance Abuse:  Yes:  Type: Smoker Significant Maternal Medications:  None Significant Maternal Lab Results: Group B Strep positive    Assessment: 25 y.o. G8T1572 [redacted]w[redacted]d SROM  Latent stage of labor FHR category 1 GBS pos Pain management plan: epidural   Plan:  Admit to L&D Routine admission orders Epidural PRN PCN for GBS prophylaxis Pitocin augmentation  Dr Su Hilt and Connye Burkitt notified of admission and plan of care  Roma Schanz MSN, CNM

## 2020-12-29 NOTE — Anesthesia Postprocedure Evaluation (Signed)
Anesthesia Post Note  Patient: Nicole Carson  Procedure(s) Performed: AN AD HOC LABOR EPIDURAL     Patient location during evaluation: Mother Baby Anesthesia Type: Epidural Level of consciousness: awake and alert Pain management: pain level controlled Vital Signs Assessment: post-procedure vital signs reviewed and stable Respiratory status: spontaneous breathing, nonlabored ventilation and respiratory function stable Cardiovascular status: stable Postop Assessment: no headache, no backache and epidural receding Anesthetic complications: no   No notable events documented.  Last Vitals:  Vitals:   12/29/20 0642 12/29/20 1051  BP: 135/74 106/68  Pulse: 78 62  Resp: 18 19  Temp: 36.9 C 36.9 C  SpO2:      Last Pain:  Vitals:   12/29/20 1220  TempSrc:   PainSc: 1    Pain Goal: Patients Stated Pain Goal: 3 (12/29/20 1220)                 Cristina Ceniceros

## 2020-12-29 NOTE — Progress Notes (Signed)
Post Partum Day 0 (delivery at 03:28am) Subjective: no complaints, up ad lib, voiding, tolerating PO, + flatus, and breast-feeding w/o difficulty.  Mother and baby are bonding well.   Objective: Blood pressure 106/68, pulse 62, temperature 98.5 F (36.9 C), temperature source Axillary, resp. rate 19, weight 126.5 kg, SpO2 100 %, unknown if currently breastfeeding.  Physical Exam:  General: alert, cooperative, and no distress Lochia: appropriate Uterine Fundus: firm Incision: n/a DVT Evaluation: No evidence of DVT seen on physical exam. Negative Homan's sign.  Recent Labs    12/28/20 1755  HGB 10.9*  HCT 32.4*    Assessment/Plan: PPD#0 s/p SVD doing well.  Continue routine post partum care Circumcision to be done prior to discharge Considering Nexplanon for contraception    LOS: 1 day   [facility] Nicole Carson 12/29/2020, 1:20 PM

## 2020-12-29 NOTE — Plan of Care (Signed)

## 2020-12-29 NOTE — Lactation Note (Signed)
This note was copied from a baby's chart. Lactation Consultation Note  Patient Name: Nicole Carson Date: 12/29/2020 Reason for consult: Initial assessment Age4 Hours  Mother reports that she attempt to breastfeed her other children for one week without success. Mother reports that she has inverted nipples.  Mother reports that infant has brestfeed one good time.  Mother agreeable for LC to assist. Mother doesn't have inverted nipples.  She was taught to firm nipples and hand express.  Infant latched on well in football hold. Infant sustained latch for 10 mins. Observed pinch when infant released the breast.  Infant placed on alternate breast. Latch much deeper. Observed suckling and swallows.  Observed pinch when infant released that breast. Observed that infant has a tight thin anterior frenulum. Assist mother with hand expression and infant was fed 1.5 ml with a spoon.  Mother to continue to breastfeed and spoon feed after.  She was given a hand pump with instructions.   Maternal Data Has patient been taught Hand Expression?: Yes Does the patient have breastfeeding experience prior to this delivery?: Yes How long did the patient breastfeed?: mother reports that she attempt to breastefeed other children for 1 week  Feeding Mother's Current Feeding Choice: Breast Milk and Formula  LATCH Score Latch: Grasps breast easily, tongue down, lips flanged, rhythmical sucking.  Audible Swallowing: Spontaneous and intermittent  Type of Nipple: Everted at rest and after stimulation  Comfort (Breast/Nipple): Soft / non-tender  Hold (Positioning): Assistance needed to correctly position infant at breast and maintain latch.  LATCH Score: 9   Lactation Tools Discussed/Used    Interventions Interventions: Skin to skin;Assisted with latch;Breast feeding basics reviewed;Breast massage;Hand express;Breast compression;Adjust position;Position options;Support pillows;Expressed  milk;Hand pump;Education;"The NICU and Your Baby" book  Discharge Pump: Personal ([name]) WIC Program: No  Consult Status Consult Status: Follow-up Date: 12/29/20 Follow-up type: In-patient    Stevan Born [facility] 12/29/2020, 11:15 AM

## 2020-12-30 LAB — CBC
HCT: 29.5 % — ABNORMAL LOW (ref 36.0–46.0)
Hemoglobin: 9.8 g/dL — ABNORMAL LOW (ref 12.0–15.0)
MCH: 28.5 pg (ref 26.0–34.0)
MCHC: 33.2 g/dL (ref 30.0–36.0)
MCV: 85.8 fL (ref 80.0–100.0)
Platelets: 234 10*3/uL (ref 150–400)
RBC: 3.44 MIL/uL — ABNORMAL LOW (ref 3.87–5.11)
RDW: 12.8 % (ref 11.5–15.5)
WBC: 10.1 10*3/uL (ref 4.0–10.5)
nRBC: 0 % (ref 0.0–0.2)

## 2020-12-30 MED ORDER — IBUPROFEN 600 MG PO TABS: 600.0000 mg | ORAL_TABLET | Freq: Four times a day (QID) | ORAL | 0 refills | Status: AC

## 2020-12-30 MED ORDER — ACETAMINOPHEN 325 MG PO TABS: 650.0000 mg | ORAL_TABLET | ORAL | Status: AC | PRN

## 2020-12-30 NOTE — Clinical Social Work Maternal (Addendum)
CLINICAL SOCIAL WORK MATERNAL/CHILD NOTE  Patient Details  Name: Nicole Carson MRN: [id number] Date of Birth: [birth year]  Date:  12/30/2020  Clinical Social Worker Initiating Note:  Nicole Carson, [place] Date/Time: Initiated:  12/30/20/1148     Child's Name:  Nicole Carson   Biological Parents:  Mother, Father Nicole Carson)   Need for Interpreter:  None   Reason for Referral:  Current Substance Use/Substance Use During Pregnancy     Address:  [address] [place] [address]    Phone number:  [phone] (home)     Additional phone number:   Household Members/Support Persons (HM/SP):   Household Member/Support Person 1   HM/SP Name Relationship DOB or Age  HM/SP -1 Nicole Carson Significant Other 40  HM/SP -2        HM/SP -3        HM/SP -4        HM/SP -5        HM/SP -6        HM/SP -7        HM/SP -8          Natural Supports (not living in the home):      Professional Supports: None   Employment: Animator   Type of Work: Land @ Catering manager:  [employer] school graduate   Homebound arranged:    Museum/gallery curator Resources:  [employer]   Other Resources:  Physicist, medical   (Applying for [employer])   Cultural/Religious Considerations Which May Impact Care:    Strengths:  Ability to meet basic needs  , Home prepared for child     Psychotropic Medications:         Pediatrician:       Pediatrician List:   [place]      Pediatrician Fax Number:    Risk Factors/Current Problems:  Substance Use     Cognitive State:  Able to Concentrate  , Alert  , Linear Thinking  , Insightful     Mood/Affect:  Calm  , Comfortable  , Relaxed     CSW Assessment: CSW received consult for Nicole Carson use, SIDS death 05/03/19 (4 months old). CSW met with MOB to offer support and complete assessment.    CSW met with MOB at bedside and introduced CSW role. CSW observed MOB  sitting up in bed trying to make a follow up for the infant and the infant asleep in the bassinet. MOB reported frustration with not finding a pediatrician. CSW encouraged MOB to ask the nurse for support if needed. MOB understanding. MOB confirmed the demographic information on hospital file was correct. MOB stated she lives in an Extended Stay with FOB Nicole Carson and identified him as her only support. She does not have family in the area. MOB shared she moved from [facility] about nine months ago because she wanted live somewhere new. CSW inquired about MOB children. MOB disclosed in August 2021 her child (infant age) stopped breathing while asleep. MOB reported after the loss, she suffered what felt like a mental break down and tried to get help at the hospital emergency department. MOB shared a CPS case was then opened in [facility]) and her two children were placed in the custody of their fathers. MOB reported her son Nicole Carson (4) lives in [place]  and her  son Nicole Carson (74) lives in [place]. MOB reported she still has parental rights of both children. CSW inquired about any additional cases with CPS. MOB reported she had previous cases for marijuana use. CSW inquired about MOB substance use during pregnancy. MOB reported she smoked marijuana daily during the pregnancy, for recreation use. MOB reported she last used the day before she was admitted for induction. MOB reported she used nicotine daily as well. MOB was made aware the infant's UDS resulted positive for [facility] and a report will be filed with [facility] CPS. MOB made aware that CSW will monitor the infant's CDS and make another report, if warranted. MOB reported understanding and had no questions.   CSW inquired how MOB has felt sine giving birth. MOB expressed feeling fine and during the pregnancy she was mostly tired but ok. CSW inquired about MOB mental health history. MOB denied mental health history. MOB  reported no therapy or medication treatment. MOB denied experienced PPD. MOB expressed frustration, when she tried to seek support during her time of grief, she lost her children. CSW listened to MOB concerns and encouraged her to use the mental health resources if needed. CSW gave specifics on grief counselors in the area.  CSW provided education regarding the baby blues period vs. perinatal mood disorders, discussed treatment and gave resources for mental health follow up if concerns arise.  CSW recommended MOB complete a self-evaluation during the postpartum time period using the New Mom Checklist from Postpartum Progress and encouraged MOB to contact a medical professional if symptoms are noted at any time. CSW assessed MOB for safety. MOB reported no thoughts of harm to self and others.   CSW inquired if MOB has items for the infant. MOB reported she has essential items for the infant CSW provided review of Sudden Infant Death Syndrome (SIDS) precautions. CSW educated MOB about not smoking around the infant, washing hands, and changing clothes before handling the infant. MOB asked what to do if there is smoke in the home now. CSW suggested having FOB open windows to allow the home to air out prior to the infant's arrival or purchasing an air purifier.  MOB receptive to the suggestion. MOB reported she receives FS and will apply of WIC. MOB reported she will have transportation to her appointments. CSW asked about housing resources for income-based living. CSW provided MOB with DSS website to follow up. CSW assessed MOB for additional needs. MOB reported no further needs.   -CSW filed a report with [facility] CPS  CSW Plan/Description:  Sudden Infant Death Syndrome (SIDS) Education, CSW Will Continue to Monitor Umbilical Cord Tissue Drug Screen Results and Make Report if Warranted, [place], Child Protective Service Report  , Perinatal Mood and Anxiety Disorder (PMADs)  Education, CSW Awaiting CPS Disposition Plan    Nicole Hopping, LCSW 12/30/2020, 11:58 AM

## 2020-12-30 NOTE — Discharge Summary (Signed)
     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   Prenatal Labs: ABO, Rh: --/--/O POS (10/19 1743) / Rhophylac n/a Antibody: NEG (10/19 1743) Rubella: Immune (06/02 0000)  / MMR booster n/a RPR: NON REACTIVE (10/19 1755)  HBsAg: Negative (06/02 0000)  HIV: Non-reactive (06/02 0000)  GBS: Positive/-- (10/03 0000)                                    Hospital course:  Onset of Labor With Vaginal Delivery      25 y.o. yo G5P4103 at [redacted]w[redacted]d was admitted in Latent Labor on 12/28/2020. Patient had an uncomplicated labor course as follows:  Membrane Rupture Time/Date: 6:30 AM ,12/28/2020   Delivery Method:Vaginal, Spontaneous  Episiotomy: None  Lacerations:  None  Patient had an uncomplicated postpartum course.  She is ambulating, tolerating a regular diet, passing flatus, and urinating well. Patient is discharged home in stable condition on 12/30/20.  Newborn Data: Birth date:12/29/2020  Birth time:3:28 AM  Gender:Female  Living status:Living  Apgars:9 ,9  Weight:2750 g  Delivering PROVIDER: GRICE, VIVIAN B                                                            Complications: None  Newborn Data: Live born female  Birth Weight: 6 lb 1 oz (2750 g) APGAR: 9, 9  Newborn Delivery   Birth date/time: 12/29/2020 03:28:00 Delivery type: Vaginal, Spontaneous      Baby Feeding: Bottle and Breast Disposition:home with mother  Post partum procedures: none  Labs: Lab Results  Component Value Date   WBC 10.1 12/30/2020   HGB 9.8 (L) 12/30/2020   HCT 29.5 (L) 12/30/2020    MCV 85.8 12/30/2020   PLT 234 12/30/2020   CMP Latest Ref Rng & Units 06/15/2020  Glucose 70 - 99 mg/dL 85  BUN 6 - 20 mg/dL 7  Creatinine 0.44 - 1.00 mg/dL 0.42(L)  Sodium 135 - 145 mmol/L 134(L)  Potassium 3.5 - 5.1 mmol/L 3.9  Chloride 98 - 111 mmol/L 106  CO2 22 - 32 mmol/L 22  Calcium 8.9 - 10.3 mg/dL 9.0    Physical Exam @ time of discharge:  Vitals:   12/29/20 1051 12/29/20 1520 12/29/20 1933 12/30/20 0513  BP: 106/68 120/68 115/66 115/64  Pulse: 62 (!) 110 74 73  Resp: 19 18 18 16  Temp: 98.5 F (36.9 C) 98.1 F (36.7 C) 98.7 F (37.1 C) 98.7 F (37.1 C)  TempSrc: Axillary Oral Oral Oral  SpO2:  100%    Weight:       general: alert, cooperative, and no distress lochia: appropriate uterine fundus: firm perineum: intact extremities:no edema DVT Evaluation: No evidence of DVT seen on physical exam.  Discharge instructions:  "Baby and Me Booklet" Discharge Medications:  Allergies as   of 12/30/2020   No Known Allergies      Medication List     TAKE these medications    acetaminophen 325 MG tablet Commonly known as: Tylenol Take 2 tablets (650 mg total) by mouth every 4 (four) hours as needed (for pain scale < 4).   ibuprofen 600 MG tablet Commonly known as: ADVIL Take 1 tablet (600 mg total) by mouth every 6 (six) hours.   prenatal multivitamin Tabs tablet Take 1 tablet by mouth daily at 12 noon.       Diet: routine diet Activity: Advance as tolerated. Pelvic rest x 6 weeks.  Follow up:6 weeks  Signed: Emily SLAUGHTERBECK MSN, CNM 12/30/2020, 10:51 AM  

## 2020-12-30 NOTE — Lactation Note (Signed)
This note was copied from a baby's chart. Lactation Consultation Note  Patient Name: Nicole Carson [id number] Date: 12/30/2020 Reason for consult: Follow-up assessment;1st time breastfeeding;Term Age:27 hours   P3 mother whose infant is now 8 hours old.  This is a term baby at 39+0 weeks.  Mother did not breast feed her other two children.  Baby was latched in the football hold to the right breast when I arrived; shallow latch noted.  Offered to assist.  Suggested mother remove his sleeper and positioned pillows appropriately.  Mother demonstrated hand expression and colostrum noted.  Baby has a thin, short anterior frenulum.  Mother's nipple pinched when released the breast.  Assisted to obtain a deep latch.  Breast feeding basics reviewed while observing him feed for 10 minutes.  Mother denied pain with feeding.   Mother has a DEBP for home use.  Mother had some concerns related to circumcision.  I helped allay her fears by discussing the process and informed her that baby may be sleepy after the circumcision.  Reassured her that her RN would educate her on circumcision care.  Mother approved of circumcision.  Mother had questions regarding her smoking and breast feeding; advice given with the ultimate goal to stop smoking if possible.  Mother verbalized understanding.  RN updated.   Maternal Data Has patient been taught Hand Expression?: Yes Does the patient have breastfeeding experience prior to this delivery?: No How long did the patient breastfeed?: Attempted but was not successful  Feeding Mother's Current Feeding Choice: Breast Milk and Formula Nipple Type: Slow - flow (green)  LATCH Score Latch: Repeated attempts needed to sustain latch, nipple held in mouth throughout feeding, stimulation needed to elicit sucking reflex.  Audible Swallowing: None  Type of Nipple: Everted at rest and after stimulation  Comfort (Breast/Nipple): Soft / non-tender  Hold (Positioning):  Assistance needed to correctly position infant at breast and maintain latch.  LATCH Score: 6   Lactation Tools Discussed/Used    Interventions Interventions: Breast feeding basics reviewed;Assisted with latch;Skin to skin;Hand express;Breast compression;Hand pump;Position options;Support pillows;Adjust position;Education  Discharge Discharge Education: Engorgement and breast care Pump: Personal WIC Program: Yes  Consult Status Consult Status: Complete Date: 12/30/20 Follow-up type: Call as needed    [name] [name] 12/30/2020, 8:26 AM

## 2020-12-30 NOTE — Social Work (Signed)
Per CPS,  the report was screened in as a 72 hour report. CPS will follow up with MOB in next 72 hours. CSW attempted to make contact with the assigned CPS worker Higinio Roger [phone] to notify them of MOB and the infant's discharge. CSW left voicemail.   CSW discussed with pediatrician.   No barriers to discharge.   Vivi Barrack, MSW, LCSW [facility]  Clinical Social Worker  [phone] 12/30/2020  4:53 PM

## 2021-01-10 ENCOUNTER — Telehealth (HOSPITAL_COMMUNITY): Payer: Self-pay | Admitting: *Deleted

## 2021-01-10 NOTE — Telephone Encounter (Signed)
Patient asked about the bruise around her IV insertion site. RN encouraged patient to contact her provider if site looks reddened or is hot to the touch. Patient denied both. Stated bruise was healing. Patient voiced no other questions or concerns regarding her own health. EPDS = 1. Patient voiced no questions or concerns regarding baby at this time. Patient reported infant sleeps in a bassinet on his back. RN reviewed ABCs of safe sleep - patient verbalized understanding. Patient requested RN email information on hospital's virtual postpartum classes and support groups. Email sent. Deforest Hoyles, RN, 01/10/21, [phone]

## 2022-03-13 IMAGING — US US OB COMP LESS 14 WK
1 series · 14 of 27 positions shown · non-contrast
Comparison: None.

CLINICAL DATA: Abdominal cramping

EXAM:
OBSTETRIC <14 WK ULTRASOUND
TECHNIQUE: Transabdominal ultrasound was performed for evaluation of the
gestation as well as the maternal uterus and adnexal regions.

[Series 1: us ob comp less 14 wk · 27 acquisitions, 14 frames shown]
[im 1/27]
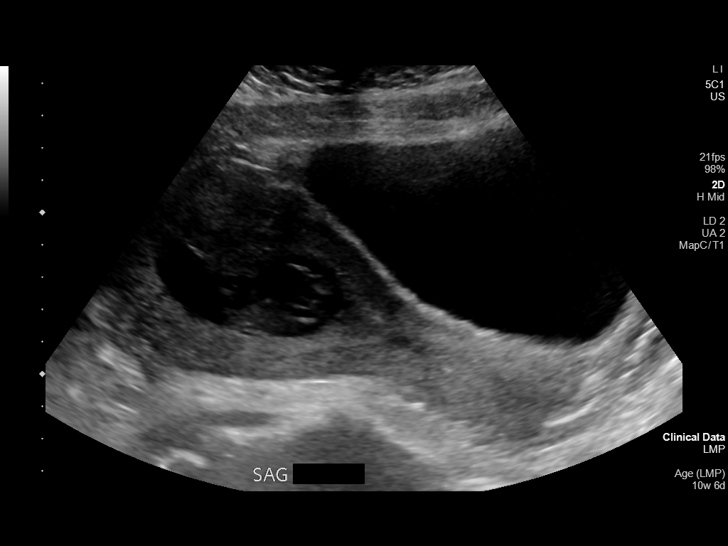
[im 3/27]
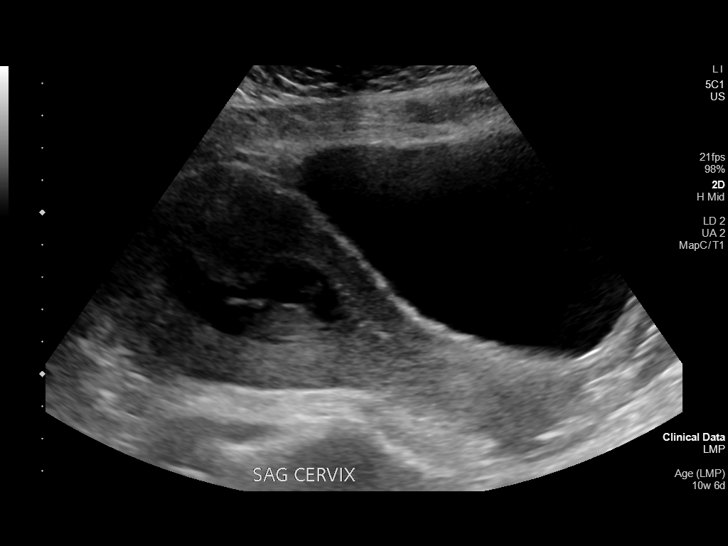
[im 5/27]
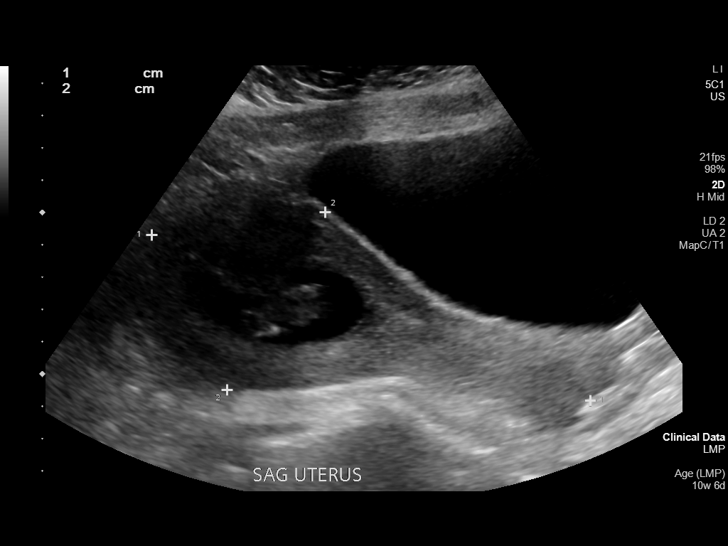
[im 7/27]
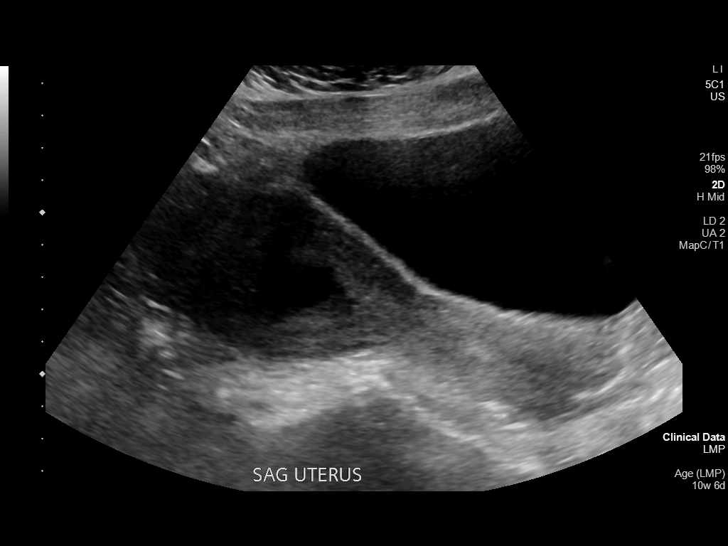
[im 9/27]
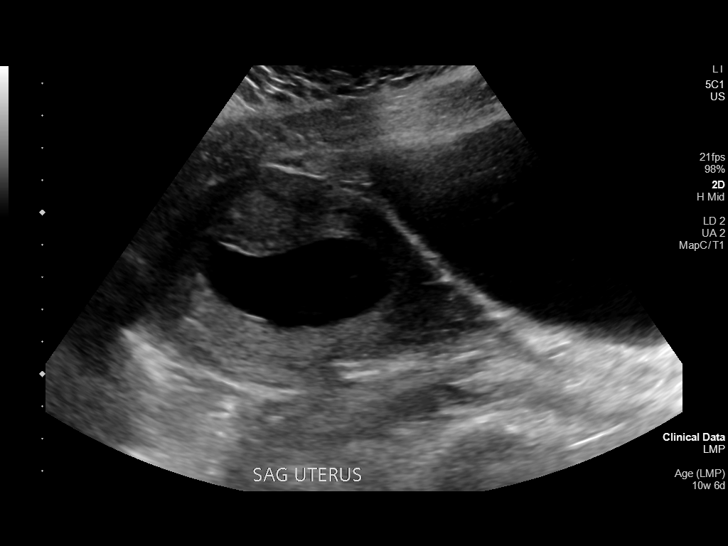
[im 11/27]
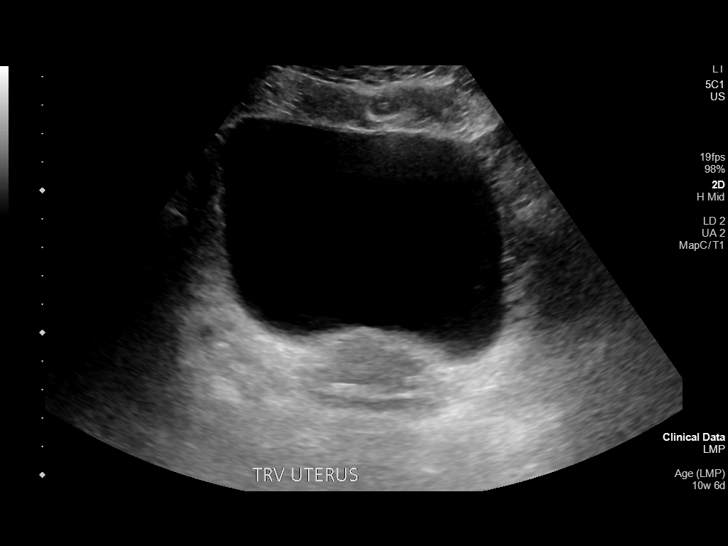
[im 13/27]
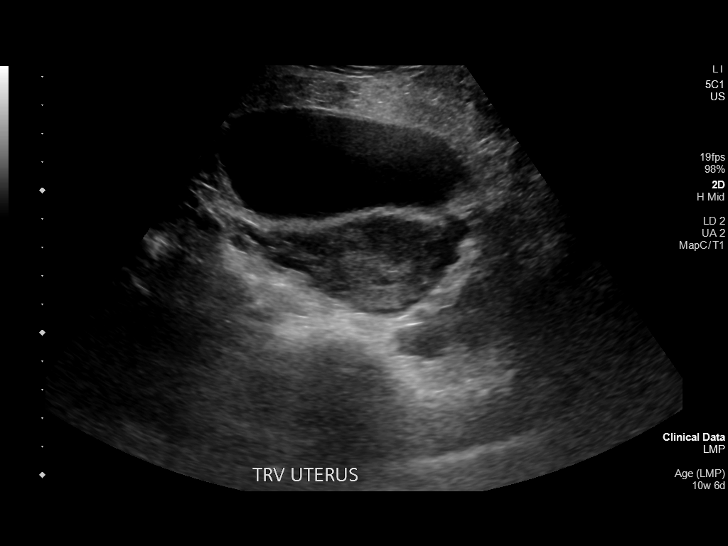
[im 15/27]
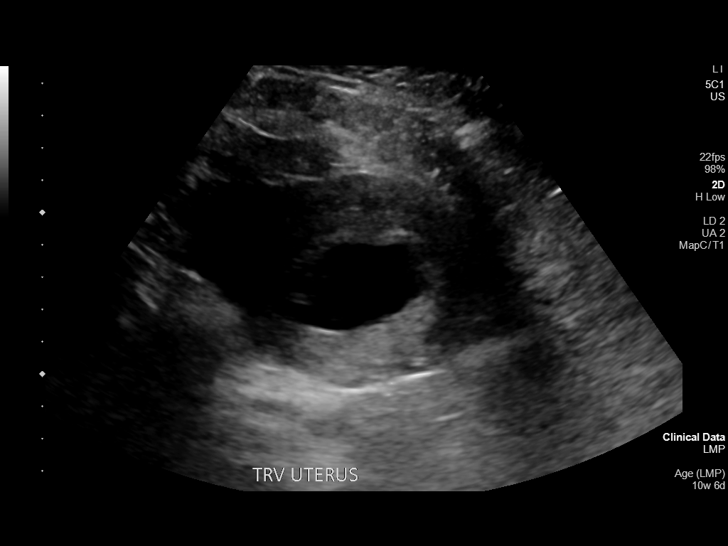
[im 17/27]
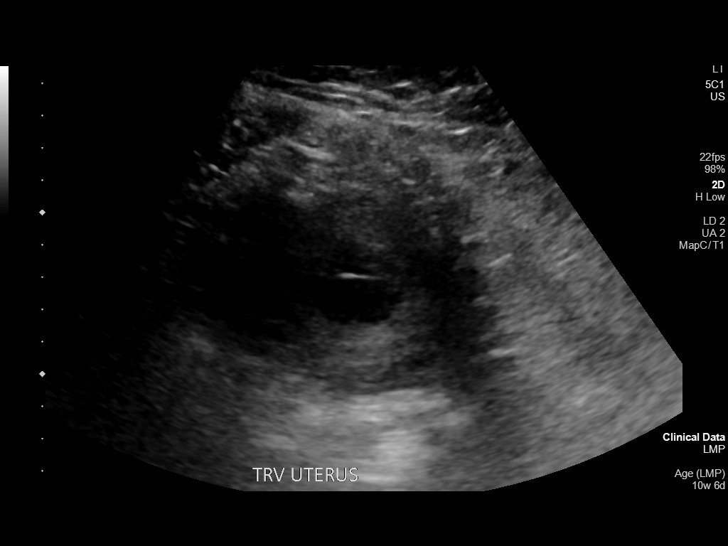
[im 19/27]
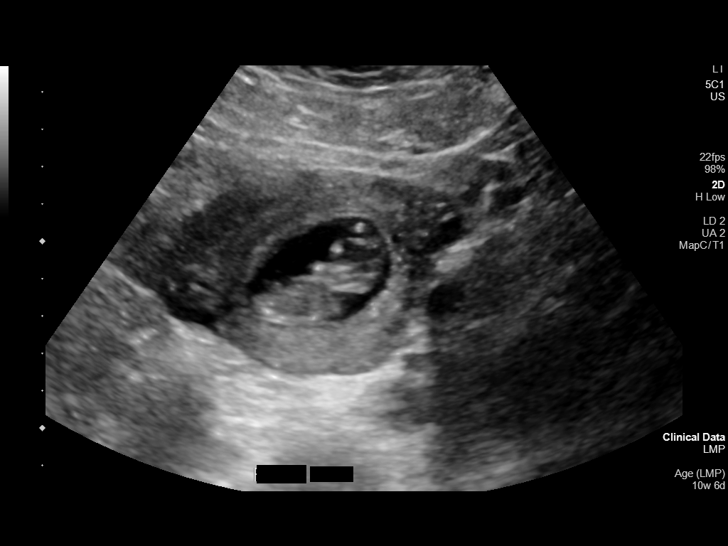
[im 21/27]
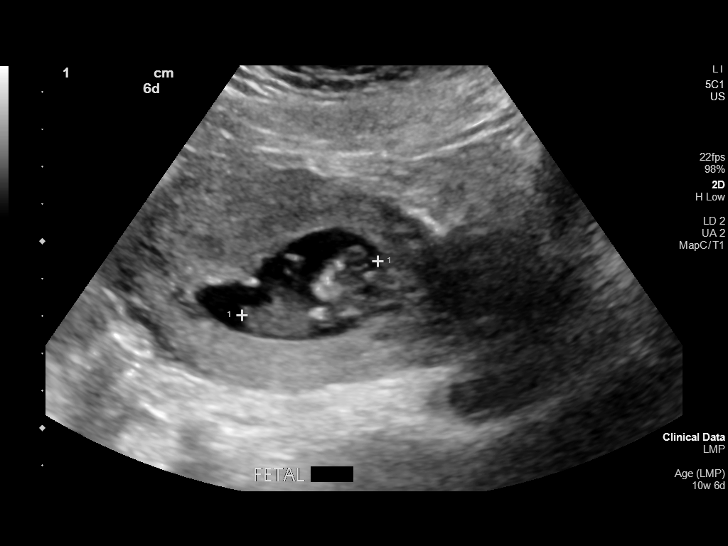
[im 23/27]
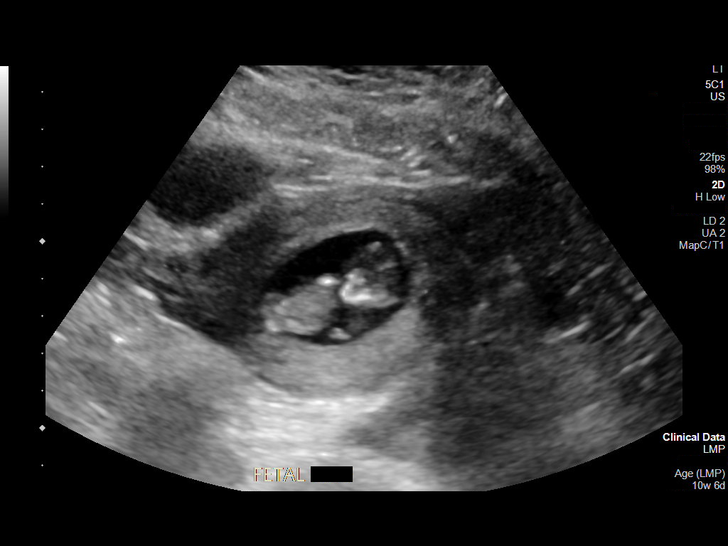
[im 25/27]
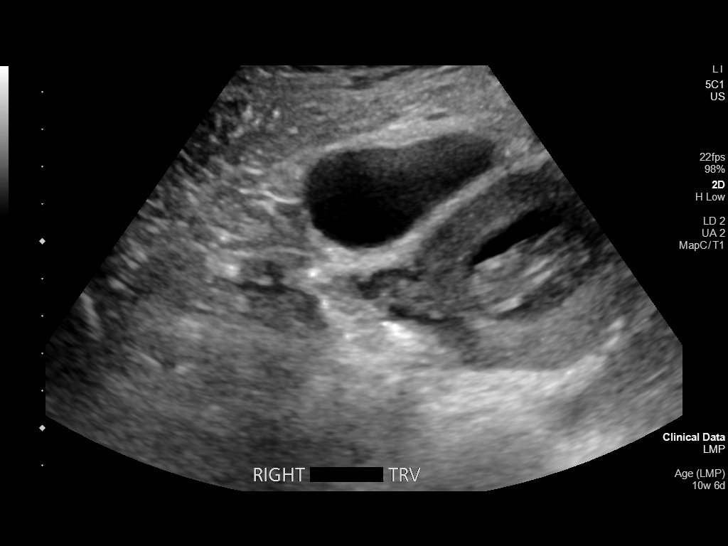
[im 27/27]
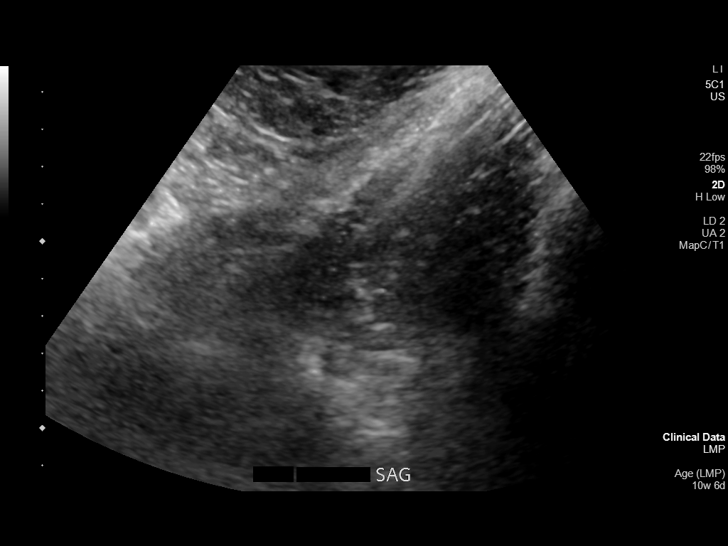

[14 of 27 positions shown; findings below may reference images not displayed]

FINDINGS: Intrauterine gestational sac: Visualized-single

Yolk sac:  Not visualized

Embryo:  Visualized

Cardiac Activity: Visualized

Heart Rate: 165 bpm

CRL: 39 mm 10 w 6 d                  US EDC: January 05, 2021

Subchorionic hemorrhage:  None visualized.

Maternal uterus/adnexae: Cervical os closed. Neither ovary could be
visualized by transabdominal technique. No extrauterine pelvic mass
or fluid evident.
IMPRESSION: Single live intrauterine gestation with estimated gestational age of
approximately 11 weeks. No subchorionic hemorrhage.

Neither ovary convincingly visualized by transabdominal technique.
No extrauterine pelvic mass or fluid appreciable on this study.

## 2022-06-02 ENCOUNTER — Encounter (HOSPITAL_COMMUNITY): Payer: Self-pay | Admitting: Emergency Medicine

## 2022-06-02 ENCOUNTER — Emergency Department (HOSPITAL_COMMUNITY)
Admission: EM | Admit: 2022-06-02 | Discharge: 2022-06-02 | Disposition: A | Discharge status: Home / Self Care | Payer: Medicaid Other | Attending: Emergency Medicine | Admitting: Emergency Medicine

## 2022-06-02 ENCOUNTER — Other Ambulatory Visit: Payer: Self-pay

## 2022-06-02 DIAGNOSIS — B9689 Other specified bacterial agents as the cause of diseases classified elsewhere: Secondary | ICD-10-CM | POA: Insufficient documentation

## 2022-06-02 DIAGNOSIS — N760 Acute vaginitis: Secondary | ICD-10-CM | POA: Insufficient documentation

## 2022-06-02 LAB — URINALYSIS, ROUTINE W REFLEX MICROSCOPIC
Bacteria, UA: NONE SEEN
Bilirubin Urine: NEGATIVE
Glucose, UA: NEGATIVE mg/dL
Ketones, ur: NEGATIVE mg/dL
Leukocytes,Ua: NEGATIVE
Nitrite: NEGATIVE
Protein, ur: NEGATIVE mg/dL
Specific Gravity, Urine: 1.019 (ref 1.005–1.030)
pH: 6 (ref 5.0–8.0)

## 2022-06-02 LAB — PREGNANCY, URINE: Preg Test, Ur: NEGATIVE

## 2022-06-02 LAB — WET PREP, GENITAL
Sperm: NONE SEEN
Trich, Wet Prep: NONE SEEN
WBC, Wet Prep HPF POC: <10 (ref <10)
Yeast Wet Prep HPF POC: NONE SEEN

## 2022-06-02 LAB — RPR: RPR Ser Ql: NONREACTIVE

## 2022-06-02 LAB — HIV ANTIBODY (ROUTINE TESTING W REFLEX): HIV Screen 4th Generation wRfx: NONREACTIVE

## 2022-06-02 MED ORDER — METRONIDAZOLE 500 MG PO TABS: 500.0000 mg | ORAL_TABLET | Freq: Two times a day (BID) | ORAL | 0 refills | Status: AC

## 2022-06-02 NOTE — ED Triage Notes (Signed)
Pt reports she has had painful urination, odd smell, increased frequency and "sometimes some cramps in the pelvic area."  "Feels like I have a STD."

## 2022-06-02 NOTE — ED Provider Notes (Signed)
[facility] Provider Note   CSN: [id number] Arrival date & time: 06/02/22  0240     History  Chief Complaint  Patient presents with   Dysuria    Paul Osterhoudt is a 27 y.o. female.  The history is provided by the patient.  Dysuria Brittyn Eberly is a 27 y.o. female who presents to the Emergency Department complaining of dysuria.  She presents emergency department complaining of dysuria, abdominal cramping for 10 days.  She is sexually active with a single female partner.  She does report some odor but no significant discharge.  Her pain comes and goes.  She did have diarrhea a few days ago but this is now gone.  No associated fever, nausea, vomiting.  She does have a remote history of trichomonas.  Denies any chance of pregnancy.  She has a Nexplanon that was placed about 10 months ago.  LMP is unknown.      Home Medications Prior to Admission medications   Medication Sig Start Date End Date Taking? Authorizing Provider  metroNIDAZOLE (FLAGYL) 500 MG tablet Take 1 tablet (500 mg total) by mouth 2 (two) times daily. 06/02/22  Yes Quintella Reichert, MD  acetaminophen (TYLENOL) 325 MG tablet Take 2 tablets (650 mg total) by mouth every 4 (four) hours as needed (for pain scale < 4). 12/30/20   Slaughterbeck, Raquel Sarna, CNM  ibuprofen (ADVIL) 600 MG tablet Take 1 tablet (600 mg total) by mouth every 6 (six) hours. 12/30/20   Slaughterbeck, Raquel Sarna, CNM  Prenatal Vit-Fe Fumarate-FA (PRENATAL MULTIVITAMIN) TABS tablet Take 1 tablet by mouth daily at 12 noon.    [provider]      Allergies    Patient has no known allergies.    Review of Systems   Review of Systems  Genitourinary:  Positive for dysuria.  All other systems reviewed and are negative.   Physical Exam Updated Vital Signs BP 139/85 (BP Location: Right Arm)   Pulse 98   Temp 98.6 F (37 C) (Oral)   Resp 16   Ht 5\' 6"  (1.676 m)   Wt 124.3 kg   SpO2 98%   BMI 44.22 kg/m   Physical Exam Vitals and nursing note reviewed.  Constitutional:      Appearance: She is well-developed.  HENT:     Head: Normocephalic and atraumatic.  Cardiovascular:     Rate and Rhythm: Normal rate and regular rhythm.  Pulmonary:     Effort: Pulmonary effort is normal. No respiratory distress.  Abdominal:     Palpations: Abdomen is soft.     Tenderness: There is no abdominal tenderness. There is no guarding or rebound.  Genitourinary:    Comments: Scant vaginal discharge, no CMT Musculoskeletal:        General: No tenderness.  Skin:    General: Skin is warm and dry.  Neurological:     Mental Status: She is alert and oriented to person, place, and time.  Psychiatric:        Behavior: Behavior normal.     ED Results / Procedures / Treatments   Labs (all labs ordered are listed, but only abnormal results are displayed) Labs Reviewed  WET PREP, GENITAL - Abnormal; Notable for the following components:      Result Value   Clue Cells Wet Prep HPF POC PRESENT (*)    All other components within normal limits  URINALYSIS, ROUTINE W REFLEX MICROSCOPIC - Abnormal; Notable for the following components:  Hgb urine dipstick MODERATE (*)    All other components within normal limits  PREGNANCY, URINE  RPR  HIV ANTIBODY (ROUTINE TESTING W REFLEX)  GC/CHLAMYDIA PROBE AMP ([place]) NOT AT [facility]    EKG None  Radiology No results found.  Procedures Procedures    Medications Ordered in ED Medications - No data to display  ED Course/ Medical Decision Making/ A&P                             Medical Decision Making Amount and/or Complexity of Data Reviewed Labs: ordered.  Risk Prescription drug management.   Patient here for ration of abdominal cramping, dysuria for 10 days.  She is nontoxic-appearing on evaluation and well-hydrated.  UA is not consistent with UTI.  Abdominal examination is benign and not consistent with appendicitis.  Pelvic examination is also  benign and not consistent with PID or cervicitis.  Wet prep does not demonstrate clue cells.  Will treat for BV in the setting of her symptoms.  Discussed with patient home care for BV.  Discussed return precautions if she has any progressive or worsening abdominal pain or new concerning symptoms.        Final Clinical Impression(s) / ED Diagnoses Final diagnoses:  BV (bacterial vaginosis)    Rx / DC Orders ED Discharge Orders          Ordered    metroNIDAZOLE (FLAGYL) 500 MG tablet  2 times daily        06/02/22 [id number]              Quintella Reichert, MD 06/02/22 [phone]

## 2022-06-04 LAB — GC/CHLAMYDIA PROBE AMP (~~LOC~~) NOT AT ARMC
Chlamydia: NEGATIVE
Comment: NEGATIVE
Comment: NORMAL
Neisseria Gonorrhea: NEGATIVE

## 2022-09-03 ENCOUNTER — Emergency Department (HOSPITAL_COMMUNITY)
Admission: EM | Admit: 2022-09-03 | Discharge: 2022-09-03 | Disposition: A | Discharge status: Home / Self Care | Payer: Commercial Managed Care - HMO | Attending: Emergency Medicine | Admitting: Emergency Medicine

## 2022-09-03 ENCOUNTER — Other Ambulatory Visit: Payer: Self-pay

## 2022-09-03 DIAGNOSIS — R21 Rash and other nonspecific skin eruption: Secondary | ICD-10-CM | POA: Insufficient documentation

## 2022-09-03 DIAGNOSIS — B372 Candidiasis of skin and nail: Secondary | ICD-10-CM

## 2022-09-03 MED ORDER — TRIAMCINOLONE ACETONIDE 0.1 % EX CREA: 1.0000 | TOPICAL_CREAM | Freq: Two times a day (BID) | CUTANEOUS | 0 refills | Status: AC

## 2022-09-03 MED ORDER — KETOCONAZOLE 2 % EX CREA: 1.0000 | TOPICAL_CREAM | Freq: Every day | CUTANEOUS | 0 refills | Status: AC

## 2022-09-03 NOTE — Discharge Instructions (Signed)
Up ply the ketoconazole cream to the abdomen 1 time daily.  Make sure that absorbs in the skin prior to using the triamcinolone cream which is a steroid for the itching of the rash.  Contact a health care provider if: Your symptoms do not improve with treatment. Your symptoms get worse or they spread. You notice increased redness and warmth. You have a fever.

## 2022-09-03 NOTE — ED Provider Notes (Signed)
[facility] EMERGENCY DEPARTMENT AT [facility] Provider Note   CSN: [id number] Arrival date & time: 09/03/22  1755     History  Chief Complaint  Patient presents with   Rash    Nicole Carson is a 27 y.o. female.  Presents with intertriginous rash on her abdomen and groin.  Is extremely itchy.  She has been seen using over-the-counter Monistat without relief.  She denies any other medical history.  She denies vaginal symptoms, fever or chills   Rash      Home Medications Prior to Admission medications   Medication Sig Start Date End Date Taking? Authorizing Provider  ketoconazole (NIZORAL) 2 % cream Apply 1 Application topically daily. 09/03/22  Yes Takiyah Bohnsack, PA-C  triamcinolone cream (KENALOG) 0.1 % Apply 1 Application topically 2 (two) times daily. 09/03/22  Yes Boni Maclellan, PA-C  acetaminophen (TYLENOL) 325 MG tablet Take 2 tablets (650 mg total) by mouth every 4 (four) hours as needed (for pain scale < 4). 12/30/20   Slaughterbeck, Irving Burton, CNM  ibuprofen (ADVIL) 600 MG tablet Take 1 tablet (600 mg total) by mouth every 6 (six) hours. 12/30/20   Slaughterbeck, Irving Burton, CNM  metroNIDAZOLE (FLAGYL) 500 MG tablet Take 1 tablet (500 mg total) by mouth 2 (two) times daily. 06/02/22   Tilden Fossa, MD  Prenatal Vit-Fe Fumarate-FA (PRENATAL MULTIVITAMIN) TABS tablet Take 1 tablet by mouth daily at 12 noon.    [provider]      Allergies    Patient has no known allergies.    Review of Systems   Review of Systems  Skin:  Positive for rash.    Physical Exam Updated Vital Signs BP 132/82 (BP Location: Right Arm)   Pulse 80   Temp 98.6 F (37 C) (Oral)   Resp 18   SpO2 100%  Physical Exam Physical Exam  Nursing note and vitals reviewed. Constitutional: She is oriented to person, place, and time. She appears well-developed and well-nourished. No distress.  Elevated BMI HENT:  Head: Normocephalic and atraumatic.  Eyes: Conjunctivae normal and  EOM are normal. Pupils are equal, round, and reactive to light. No scleral icterus.  Neck: Normal range of motion.  Cardiovascular: Normal rate, regular rhythm and normal heart sounds.  Exam reveals no gallop and no friction rub.   No murmur heard. Pulmonary/Chest: Effort normal and breath sounds normal. No respiratory distress.  Abdominal: Soft. Bowel sounds are normal. She exhibits no distension and no mass. There is no tenderness. There is no guarding.  Erythematous rash with satellite lesions consistent with tinea corporis Neurological: She is alert and oriented to person, place, and time.  Skin: Skin is warm and dry. She is not diaphoretic.   ED Results / Procedures / Treatments   Labs (all labs ordered are listed, but only abnormal results are displayed) Labs Reviewed - No data to display  EKG None  Radiology No results found.  Procedures Procedures    Medications Ordered in ED Medications - No data to display  ED Course/ Medical Decision Making/ A&P                             Medical Decision Making  Patient here with itchy rash under her pannus and in her groin consistent with i candidal intertrigo.  Will treat with ketoconazole, topical steroid, she follow-up in the outpatient setting with PCP.  Otherwise appropriate for discharge at this time  Final Clinical Impression(s) / ED Diagnoses Final diagnoses:  None    Rx / DC Orders ED Discharge Orders          Ordered    ketoconazole (NIZORAL) 2 % cream  Daily        09/03/22 2124    triamcinolone cream (KENALOG) 0.1 %  2 times daily        09/03/22 2124              Arthor Captain, PA-C 09/03/22 2125    Arby Barrette, MD 09/05/22 2154

## 2022-09-03 NOTE — ED Triage Notes (Signed)
Pt has a rash on lower abdomen and groin area for several days

## 2023-06-11 ENCOUNTER — Encounter (HOSPITAL_COMMUNITY): Payer: Self-pay

## 2023-06-11 ENCOUNTER — Other Ambulatory Visit: Payer: Self-pay

## 2023-06-11 ENCOUNTER — Emergency Department (HOSPITAL_COMMUNITY)
Admission: EM | Admit: 2023-06-11 | Discharge: 2023-06-11 | Disposition: A | Discharge status: Home / Self Care | Attending: Emergency Medicine | Admitting: Emergency Medicine

## 2023-06-11 DIAGNOSIS — R1010 Upper abdominal pain, unspecified: Secondary | ICD-10-CM | POA: Diagnosis present

## 2023-06-11 DIAGNOSIS — R519 Headache, unspecified: Secondary | ICD-10-CM | POA: Insufficient documentation

## 2023-06-11 LAB — CBC
HCT: 44.1 % (ref 36.0–46.0)
Hemoglobin: 14.7 g/dL (ref 12.0–15.0)
MCH: 30.1 pg (ref 26.0–34.0)
MCHC: 33.3 g/dL (ref 30.0–36.0)
MCV: 90.2 fL (ref 80.0–100.0)
Platelets: 291 10*3/uL (ref 150–400)
RBC: 4.89 MIL/uL (ref 3.87–5.11)
RDW: 13.2 % (ref 11.5–15.5)
WBC: 9.8 10*3/uL (ref 4.0–10.5)
nRBC: 0 % (ref 0.0–0.2)

## 2023-06-11 LAB — URINALYSIS, ROUTINE W REFLEX MICROSCOPIC
Bilirubin Urine: NEGATIVE
Glucose, UA: NEGATIVE mg/dL
Ketones, ur: NEGATIVE mg/dL
Leukocytes,Ua: NEGATIVE
Nitrite: NEGATIVE
Protein, ur: NEGATIVE mg/dL
Specific Gravity, Urine: 1.015 (ref 1.005–1.030)
pH: 7 (ref 5.0–8.0)

## 2023-06-11 LAB — COMPREHENSIVE METABOLIC PANEL WITH GFR
ALT: 26 U/L (ref 0–44)
AST: 25 U/L (ref 15–41)
Albumin: 4.1 g/dL (ref 3.5–5.0)
Alkaline Phosphatase: 75 U/L (ref 38–126)
Anion gap: 7 (ref 5–15)
BUN: 9 mg/dL (ref 6–20)
CO2: 24 mmol/L (ref 22–32)
Calcium: 9.1 mg/dL (ref 8.9–10.3)
Chloride: 104 mmol/L (ref 98–111)
Creatinine, Ser: 0.68 mg/dL (ref 0.44–1.00)
GFR, Estimated: >60 mL/min (ref >60)
Glucose, Bld: 95 mg/dL (ref 70–99)
Potassium: 3.7 mmol/L (ref 3.5–5.1)
Sodium: 135 mmol/L (ref 135–145)
Total Bilirubin: 0.9 mg/dL (ref 0.0–1.2)
Total Protein: 7 g/dL (ref 6.5–8.1)

## 2023-06-11 LAB — HCG, SERUM, QUALITATIVE: Preg, Serum: NEGATIVE

## 2023-06-11 LAB — LIPASE, BLOOD: Lipase: 23 U/L (ref 11–51)

## 2023-06-11 MED ORDER — ALUM & MAG HYDROXIDE-SIMETH 200-200-20 MG/5ML PO SUSP
30.0000 mL | Freq: Once | ORAL | Status: AC
  Administered 2023-06-11: 30 mL via ORAL
  Filled 2023-06-11: qty 30

## 2023-06-11 MED ORDER — IBUPROFEN 200 MG PO TABS
400.0000 mg | ORAL_TABLET | Freq: Once | ORAL | Status: AC
  Administered 2023-06-11: 400 mg via ORAL
  Filled 2023-06-11: qty 2

## 2023-06-11 MED ORDER — ACETAMINOPHEN 500 MG PO TABS
1000.0000 mg | ORAL_TABLET | Freq: Once | ORAL | Status: AC
  Administered 2023-06-11: 1000 mg via ORAL
  Filled 2023-06-11: qty 2

## 2023-06-11 MED ORDER — FAMOTIDINE 20 MG PO TABS
20.0000 mg | ORAL_TABLET | Freq: Once | ORAL | Status: AC
  Administered 2023-06-11: 20 mg via ORAL
  Filled 2023-06-11: qty 1

## 2023-06-11 NOTE — ED Triage Notes (Signed)
 Pt. Arrives c/o abdominal pain and back pain. States that she has been vomiting as well. Mentions having to push her urine out in order to go.

## 2023-06-11 NOTE — ED Provider Notes (Signed)
  [facility] Provider Note   CSN: [id number] Arrival date & time: 06/11/23  1148     History  Chief Complaint  Patient presents with   Abdominal Pain    Nicole Carson is a 28 y.o. female.  Patient c/o upper abdominal pain in past month. Dull intermittent pain, variable duration. No specific exacerbating or alleviating factors. No vomiting. No diarrhea, having normal bms. No acute or abrupt worsening today. Denies dysuria or hematuria. No back or flank pain. No hx pud, pancreatitis or gallstones. No fever or chills. Also notes intermittent headache, general, mild-mod. No acute, abrupt or severe head pain. No eye pain or change in vision. No associated numbness, weakness or change in normal functional ability. No head trauma or syncope. No change by position or time of day. No sinus drainage or pain.   The history is provided by the patient and medical records.  Abdominal Pain Associated symptoms: no chest pain, no chills, no constipation, no diarrhea, no dysuria, no fever, no shortness of breath, no sore throat and no vomiting        Home Medications Prior to Admission medications   Medication Sig Start Date End Date Taking? Authorizing Provider  acetaminophen (TYLENOL) 325 MG tablet Take 2 tablets (650 mg total) by mouth every 4 (four) hours as needed (for pain scale < 4). 12/30/20   Slaughterbeck, Irving Burton, CNM  ibuprofen (ADVIL) 600 MG tablet Take 1 tablet (600 mg total) by mouth every 6 (six) hours. 12/30/20   Slaughterbeck, Irving Burton, CNM  ketoconazole (NIZORAL) 2 % cream Apply 1 Application topically daily. 09/03/22   Arthor Captain, PA-C  metroNIDAZOLE (FLAGYL) 500 MG tablet Take 1 tablet (500 mg total) by mouth 2 (two) times daily. 06/02/22   Tilden Fossa, MD  Prenatal Vit-Fe Fumarate-FA (PRENATAL MULTIVITAMIN) TABS tablet Take 1 tablet by mouth daily at 12 noon.    [provider]  triamcinolone cream (KENALOG) 0.1 % Apply 1  Application topically 2 (two) times daily. 09/03/22   Arthor Captain, PA-C      Allergies    Patient has no known allergies.    Review of Systems   Review of Systems  Constitutional:  Negative for chills and fever.  HENT:  Negative for sinus pain and sore throat.   Eyes:  Negative for pain, redness and visual disturbance.  Respiratory:  Negative for shortness of breath.   Cardiovascular:  Negative for chest pain and leg swelling.  Gastrointestinal:  Positive for abdominal pain. Negative for blood in stool, constipation, diarrhea and vomiting.  Genitourinary:  Negative for dysuria, flank pain and pelvic pain.  Musculoskeletal:  Negative for back pain and neck pain.  Skin:  Negative for rash.  Neurological:  Positive for headaches. Negative for dizziness, syncope, speech difficulty, weakness and numbness.    Physical Exam Updated Vital Signs BP 106/66 (BP Location: Left Arm)   Pulse 86   Temp 98.8 F (37.1 C) (Oral)   Resp 18   SpO2 99%  Physical Exam Vitals and nursing note reviewed.  Constitutional:      Appearance: Normal appearance. She is well-developed.  HENT:     Head: Atraumatic.     Comments: No sinus or temporal tenderness.     Right Ear: Tympanic membrane normal.     Left Ear: Tympanic membrane normal.     Nose: Nose normal. No congestion or rhinorrhea.     Mouth/Throat:     Mouth: Mucous membranes are moist.  Pharynx: Oropharynx is clear. No oropharyngeal exudate or posterior oropharyngeal erythema.  Eyes:     General: No scleral icterus.    Conjunctiva/sclera: Conjunctivae normal.     Pupils: Pupils are equal, round, and reactive to light.  Neck:     Trachea: No tracheal deviation.     Comments: No stiffness or rigidity Cardiovascular:     Rate and Rhythm: Normal rate and regular rhythm.     Pulses: Normal pulses.     Heart sounds: Normal heart sounds. No murmur heard.    No friction rub. No gallop.  Pulmonary:     Effort: Pulmonary effort is  normal. No respiratory distress.     Breath sounds: Normal breath sounds.  Abdominal:     General: Bowel sounds are normal. There is no distension.     Palpations: Abdomen is soft. There is no mass.     Tenderness: There is no abdominal tenderness. There is no guarding or rebound.     Hernia: No hernia is present.  Genitourinary:    Comments: No cva tenderness.  Musculoskeletal:        General: No swelling or tenderness.     Cervical back: Normal range of motion and neck supple. No rigidity. No muscular tenderness.  Lymphadenopathy:     Cervical: No cervical adenopathy.  Skin:    General: Skin is warm and dry.     Findings: No rash.  Neurological:     Mental Status: She is alert.     Comments: Alert, speech normal. Motor/sens grossly intact bil. Steady gait.    Psychiatric:        Mood and Affect: Mood normal.     ED Results / Procedures / Treatments   Labs (all labs ordered are listed, but only abnormal results are displayed) Results for orders placed or performed during the hospital encounter of 06/11/23  Lipase, blood   Collection Time: 06/11/23 12:06 PM  Result Value Ref Range   Lipase 23 11 - 51 U/L  Comprehensive metabolic panel   Collection Time: 06/11/23 12:06 PM  Result Value Ref Range   Sodium 135 135 - 145 mmol/L   Potassium 3.7 3.5 - 5.1 mmol/L   Chloride 104 98 - 111 mmol/L   CO2 24 22 - 32 mmol/L   Glucose, Bld 95 70 - 99 mg/dL   BUN 9 6 - 20 mg/dL   Creatinine, Ser 1.61 0.44 - 1.00 mg/dL   Calcium 9.1 8.9 - 09.6 mg/dL   Total Protein 7.0 6.5 - 8.1 g/dL   Albumin 4.1 3.5 - 5.0 g/dL   AST 25 15 - 41 U/L   ALT 26 0 - 44 U/L   Alkaline Phosphatase 75 38 - 126 U/L   Total Bilirubin 0.9 0.0 - 1.2 mg/dL   GFR, Estimated >04 >54 mL/min   Anion gap 7 5 - 15  CBC   Collection Time: 06/11/23 12:06 PM  Result Value Ref Range   WBC 9.8 4.0 - 10.5 K/uL   RBC 4.89 3.87 - 5.11 MIL/uL   Hemoglobin 14.7 12.0 - 15.0 g/dL   HCT 09.8 11.9 - 14.7 %   MCV 90.2 80.0  - 100.0 fL   MCH 30.1 26.0 - 34.0 pg   MCHC 33.3 30.0 - 36.0 g/dL   RDW 82.9 56.2 - 13.0 %   Platelets 291 150 - 400 K/uL   nRBC 0.0 0.0 - 0.2 %  Urinalysis, Routine w reflex microscopic -Urine, Clean Catch   Collection Time:  06/11/23 12:06 PM  Result Value Ref Range   Color, Urine YELLOW YELLOW   APPearance HAZY (A) CLEAR   Specific Gravity, Urine 1.015 1.005 - 1.030   pH 7.0 5.0 - 8.0   Glucose, UA NEGATIVE NEGATIVE mg/dL   Hgb urine dipstick MODERATE (A) NEGATIVE   Bilirubin Urine NEGATIVE NEGATIVE   Ketones, ur NEGATIVE NEGATIVE mg/dL   Protein, ur NEGATIVE NEGATIVE mg/dL   Nitrite NEGATIVE NEGATIVE   Leukocytes,Ua NEGATIVE NEGATIVE   RBC / HPF 0-5 0 - 5 RBC/hpf   WBC, UA 0-5 0 - 5 WBC/hpf   Bacteria, UA RARE (A) NONE SEEN   Squamous Epithelial / HPF 6-10 0 - 5 /HPF   Mucus PRESENT   hCG, serum, qualitative   Collection Time: 06/11/23 12:06 PM  Result Value Ref Range   Preg, Serum NEGATIVE NEGATIVE      EKG None  Radiology No results found.  Procedures Procedures    Medications Ordered in ED Medications  acetaminophen (TYLENOL) tablet 1,000 mg (has no administration in time range)  ibuprofen (ADVIL) tablet 400 mg (has no administration in time range)    ED Course/ Medical Decision Making/ A&P                                 Medical Decision Making Problems Addressed: Intermittent headache: acute illness or injury Upper abdominal pain: acute illness or injury with systemic symptoms that poses a threat to life or bodily functions  Amount and/or Complexity of Data Reviewed External Data Reviewed: notes. Labs: ordered. Decision-making details documented in ED Course.  Risk OTC drugs. Decision regarding hospitalization.   Labs ordered/sent.   Differential diagnosis includes pancreatitis, pud, gastritis, abd cramping, etc. Dispo decision including potential need for admission considered - will get labs and reassess.   Reviewed nursing notes and  prior charts for additional history. External reports reviewed.   Labs reviewed/interpreted by me - lipase normal. Chem normal. Wbc and hgb normal.   No meds today or pta. Acetaminophen po, ibuprofen po. Po fluids. Pepcid po. Maalox po.   Abd is soft and non tender. No nv.   Pt appears comfortable, no acute distress. Benign abd exam. Vitals normal. Pt currently appears stable for ED d/c.   Rec close pcp f/u.  Return precautions provided.          Final Clinical Impression(s) / ED Diagnoses Final diagnoses:  None    Rx / DC Orders ED Discharge Orders     None         Cathren Laine, MD 06/11/23 1445

## 2023-06-11 NOTE — Discharge Instructions (Addendum)
 It was our pleasure to provide your ER care today - we hope that you feel better. Overall, your ER tests look good.   Drink plenty of fluids/stay well hydrated.  You may try taking pepcid and maalox as need for symptom relief.   You may take ibuprofen or acetaminophen as need for pain.   Follow up closely with primary care doctor in the next 1-2 weeks.   Return to ER if worse, new symptoms, new or worsening or severe abdominal pain, persistent vomiting, fevers, severe headaches, or other concern.

## 2023-07-17 ENCOUNTER — Ambulatory Visit: Admitting: Podiatry
